# Patient Record
Sex: Male | Born: 1970
Health system: Southern US, Community
[De-identification: ages and names within clinical notes are randomized; demographics above are authoritative.]

---

## 2012-01-21 ENCOUNTER — Ambulatory Visit (INDEPENDENT_AMBULATORY_CARE_PROVIDER_SITE_OTHER): Payer: BC Managed Care – PPO | Admitting: Emergency Medicine

## 2012-01-21 VITALS — BP 138/80 | HR 78 | Temp 98.2°F | Resp 17 | Ht 70.0 in | Wt 225.0 lb

## 2012-01-21 DIAGNOSIS — B351 Tinea unguium: Secondary | ICD-10-CM

## 2012-01-21 DIAGNOSIS — H6590 Unspecified nonsuppurative otitis media, unspecified ear: Secondary | ICD-10-CM

## 2012-01-21 MED ORDER — PSEUDOEPHEDRINE-GUAIFENESIN ER 120-1200 MG PO TB12: 1.0000 | ORAL_TABLET | Freq: Two times a day (BID) | ORAL | Status: DC

## 2012-01-21 MED ORDER — TERBINAFINE HCL 250 MG PO TABS: 250.0000 mg | ORAL_TABLET | Freq: Every day | ORAL | Status: AC

## 2012-01-21 NOTE — Progress Notes (Signed)
   Patient Name: Jerome Gallegos Date of Birth: 05/02/1971 Medical Record Number: [id number] Gender: male Date of Encounter: 01/21/2012  Chief Complaint: Otalgia and Nail Problem   History of Present Illness:  Jerome Gallegos is a 41 y.o. very pleasant male patient who presents with the following:  Since Friday has experienced decreased hearing in right ear and no pain, discharge, fever or chills.  History of nail fungus previously treated now recurrent 3 years later.  There is no problem list on file for this patient.  No past medical history on file. No past surgical history on file. History  Substance Use Topics  . Smoking status: Never Smoker   . Smokeless tobacco: Not on file  . Alcohol Use: Not on file   No family history on file. No Known Allergies  Medication list has been reviewed and updated.  No current outpatient prescriptions on file prior to visit.    Review of Systems:  As per HPI, otherwise negative.    Physical Examination: Filed Vitals:   01/21/12 1659  BP: 138/80  Pulse: 78  Temp: 98.2 F (36.8 C)  Resp: 17   Filed Vitals:   01/21/12 1659  Height: 5\' 10"  (1.778 m)  Weight: 225 lb (102.059 kg)   Body mass index is 32.28 kg/(m^2). Ideal Body Weight: Weight in (lb) to have BMI = 25: 173.9    GEN: WDWN, NAD, Non-toxic, Alert & Oriented x 3 HEENT: Atraumatic, Normocephalic.  Ears and Nose: No external deformity.  Right ear serous otitis EXTR: No clubbing/cyanosis/edema.  Bilateral onychomycosis NEURO: Normal gait.  PSYCH: Normally interactive. Conversant. Not depressed or anxious appearing.  Calm demeanor.    EKG / Labs / Xrays: None available at time of encounter  Assessment and Plan: Serous otitis media Onychomycosis Rx:  mucinex and lamisil  Jerome Dane, MD

## 2014-04-11 ENCOUNTER — Encounter (HOSPITAL_COMMUNITY): Payer: Self-pay | Admitting: Emergency Medicine

## 2014-04-11 DIAGNOSIS — Z79899 Other long term (current) drug therapy: Secondary | ICD-10-CM | POA: Insufficient documentation

## 2014-04-11 DIAGNOSIS — N489 Disorder of penis, unspecified: Secondary | ICD-10-CM | POA: Diagnosis present

## 2014-04-11 DIAGNOSIS — N501 Vascular disorders of male genital organs: Secondary | ICD-10-CM | POA: Diagnosis not present

## 2014-04-11 LAB — URINALYSIS, ROUTINE W REFLEX MICROSCOPIC
Bilirubin Urine: NEGATIVE
GLUCOSE, UA: NEGATIVE mg/dL
KETONES UR: NEGATIVE mg/dL
LEUKOCYTES UA: NEGATIVE
NITRITE: NEGATIVE
PH: 6 (ref 5.0–8.0)
PROTEIN: NEGATIVE mg/dL
Specific Gravity, Urine: 1.021 (ref 1.005–1.030)
Urobilinogen, UA: 0.2 mg/dL (ref 0.0–1.0)

## 2014-04-11 LAB — URINE MICROSCOPIC-ADD ON

## 2014-04-11 NOTE — ED Notes (Addendum)
PT was urinating and squeezed penis hard and felt pain about 1530. Noticed blood at 2000. Bleeding coming out tip. Concerned for injury. PT reports no pain when urination. Feeling urgency.

## 2014-04-12 ENCOUNTER — Emergency Department (HOSPITAL_COMMUNITY)
Admission: EM | Admit: 2014-04-12 | Discharge: 2014-04-12 | Disposition: A | Discharge status: Home / Self Care | Payer: BC Managed Care – PPO | Attending: Emergency Medicine | Admitting: Emergency Medicine

## 2014-04-12 DIAGNOSIS — N4889 Other specified disorders of penis: Secondary | ICD-10-CM

## 2014-04-12 NOTE — ED Notes (Signed)
Tiffany, PA-C at bedside to examine, with RN present as witness.

## 2014-04-12 NOTE — ED Provider Notes (Signed)
CSN: [id number]     Arrival date & time 04/11/14  2259 History   First MD Initiated Contact with Patient 04/12/14 0040     Chief Complaint  Patient presents with  . Hematuria  . Penile Discharge     (Consider location/radiation/quality/duration/timing/severity/associated sxs/prior Treatment) HPI  Patient to the ER with complaints of penile pain and bleeding. He reports using the restroom when he heard someone walking in on him. He therefore accidently squeezed the shaft of his penis causing immediate pain. A few hours later he noticed a small amount of bleeding in his underwear that had come from his penis. He is having mild urinary hesitancy and a small amount of hematuria. He reports that his urine has since cleared up. He does not have any pain when he is not urinary and when he is urinating he has no pain. No fevers, denies deformity.  History reviewed. No pertinent past medical history. History reviewed. No pertinent past surgical history. History reviewed. No pertinent family history. History  Substance Use Topics  . Smoking status: Never Smoker   . Smokeless tobacco: Not on file  . Alcohol Use: Not on file    Review of Systems   Review of Systems  Gen: no weight loss, fevers, chills, night sweats  Eyes: no occular draining, occular pain,  No visual changes  Nose: no epistaxis or rhinorrhea  Mouth: no dental pain, no sore throat  Neck: no neck pain  Lungs: No hemoptysis. No wheezing or coughing CV:  No palpitations, dependent edema or orthopnea. No chest pain Abd: no diarrhea. No nausea or vomiting, No abdominal pain  GU: + urinary hesitancy, penile pain, hematuria MSK:  No muscle weakness, No muscular pain Neuro: no headache, no focal neurologic deficits  Skin: no rash , no wounds Psyche: no complaints of depression or anxiety     Allergies  Review of patient's allergies indicates no known allergies.  Home Medications   Prior to Admission medications    Medication Sig Start Date End Date Taking? Authorizing Provider  GuaiFENesin (MUCINEX PO) Take 1 tablet by mouth 2 (two) times daily.   Yes Historical Provider, MD   BP 127/76  Pulse 57  Temp(Src) 97.7 F (36.5 C) (Oral)  Resp 20  SpO2 100% Physical Exam  Nursing note and vitals reviewed. Constitutional: He appears well-developed and well-nourished. No distress.  HENT:  Head: Normocephalic and atraumatic.  Eyes: Pupils are equal, round, and reactive to light.  Neck: Normal range of motion. Neck supple.  Cardiovascular: Normal rate and regular rhythm.   Pulmonary/Chest: Effort normal.  Abdominal: Soft.  Genitourinary: Uncircumcised. No penile erythema. No discharge found.  No swelling, laceration, deformity or tenderness to the head or shaft of the penis.  Neurological: He is alert.  Skin: Skin is warm and dry.    ED Course  Procedures (including critical care time) Labs Review Labs Reviewed  URINALYSIS, ROUTINE W REFLEX MICROSCOPIC - Abnormal; Notable for the following:    Hgb urine dipstick SMALL (*)    All other components within normal limits  GC/CHLAMYDIA PROBE AMP  URINE MICROSCOPIC-ADD ON    Imaging Review No results found.   EKG Interpretation None      MDM   Final diagnoses:  Penile bleeding  Penis pain    Pt accidentally squeezed his penis too hard earlier today. Was concerned because he was actively passing blood earlier today. This has stopped over the past few hours and he has urinated multiple times and denies  continually having bleeding. Reports that pain has also resolved. Denies any more penile pain. NO bruising of obvious injury to the shaft. Most likely bruising and broken blood vessels that have gained hemostasis.   Advised to give himself 24-48 hours for symptoms to resolve. Should not have anymore active bleeding. If he does needs to be seen right away. Referral to urology given.  43 y.o.Florence Brouillard's evaluation in the Emergency  Department is complete. It has been determined that no acute conditions requiring further emergency intervention are present at this time. The patient/guardian have been advised of the diagnosis and plan. We have discussed signs and symptoms that warrant return to the ED, such as changes or worsening in symptoms.  Vital signs are stable at discharge. Filed Vitals:   04/12/14 0123  BP: 127/76  Pulse: 57  Temp: 97.7 F (36.5 C)  Resp: 20    Patient/guardian has voiced understanding and agreed to follow-up with the PCP or specialist.     Dorthula Matas, PA-C 04/12/14 0129

## 2014-04-12 NOTE — ED Provider Notes (Signed)
Medical screening examination/treatment/procedure(s) were performed by non-physician practitioner and as supervising physician I was immediately available for consultation/collaboration.   EKG Interpretation None        Tomasita Crumble, MD 04/12/14 4696

## 2014-04-12 NOTE — Discharge Instructions (Signed)
Hematuria °(Hematuria) °La hematuria es la presencia de sangre en la orina. La causa puede ser una infección en la vejiga, en los riñones, en la próstata, cálculos renales o cáncer de las vías urinarias. Normalmente las infecciones pueden tratarse con medicamentos, y los cálculos renales, por lo general, se eliminarán a través de la orina. Si ninguna de estas es la causa de su hematuria, quizás se necesiten más estudios para descubrir el motivo. °Es muy importante que le informe a su médico si ve sangre en la orina, aunque el sangrado se detenga sin tratamiento o no cause dolor. La sangre que aparece en la orina y luego se detiene y vuelve a aparecer nuevamente puede ser un síntoma de una enfermedad muy grave. Además, el dolor no es un síntoma en las etapas iniciales de muchos tipos de cáncer urinarios. °INSTRUCCIONES PARA EL CUIDADO EN EL HOGAR  °· Beba mucho líquido, entre 3 a 4 litros por día. Si le han diagnosticado una infección, se recomienda especialmente el jugo de arándanos rojos, además de grandes cantidades de agua. °· Evite la cafeína, el té y las bebidas con gas porque suelen irritar la vejiga. °· Evite el alcohol ya que puede irritar la próstata. °· Tome todos los medicamentos como se lo haya indicado el médico. °· Si le recetaron antibióticos, asegúrese de terminarlos, incluso si comienza a sentirse mejor. °· Si le han diagnosticado cálculos renales, siga las instrucciones de su médico con respecto a filtrar la orina para rescatar el cálculo. °· Vacíe la vejiga con frecuencia. Evite retener la orina durante largos períodos. °· Después de defecar, las mujeres deben higienizarse desde adelante hacia atrás. Use solo un papel por vez. °· Si es una mujer, vacíe la vejiga antes y después de tener relaciones sexuales. °SOLICITE ATENCIÓN MÉDICA SI: °· Siente dolor en la espalda. °· Tiene fiebre. °· Tiene sensación de malestar estomacal (náuseas) o vómitos. °· Los síntomas no mejoran después de 3 días. Si la  condición empeora, visite al médico antes de lo previsto. °SOLICITE ATENCIÓN MÉDICA DE INMEDIATO SI:  °· Vomita con frecuencia e intensamente y no puede tolerar los medicamentos. °· Siente un dolor intenso en la espalda o el abdomen incluso tomando los medicamentos. °· Elimina coágulos grandes o sangre con la orina. °· Se siente extremadamente débil, se desmaya o pierde el conocimiento. °ASEGÚRESE DE QUE:  °· Comprende estas instrucciones. °· Controlará su afección. °· Recibirá ayuda de inmediato si no mejora o si empeora. °Document Released: 07/07/2005 Document Revised: 11/21/2013 °ExitCare® Patient Information ©2015 [employer]. This information is not intended to replace advice given to you by your health care provider. Make sure you discuss any questions you have with your health care provider. ° °

## 2014-04-14 LAB — GC/CHLAMYDIA PROBE AMP
CT Probe RNA: NEGATIVE
GC Probe RNA: NEGATIVE

## 2017-04-17 ENCOUNTER — Ambulatory Visit (INDEPENDENT_AMBULATORY_CARE_PROVIDER_SITE_OTHER): Payer: 59 | Admitting: Emergency Medicine

## 2017-04-17 ENCOUNTER — Encounter: Payer: Self-pay | Admitting: Emergency Medicine

## 2017-04-17 VITALS — BP 104/70 | HR 83 | Temp 98.1°F | Resp 16 | Ht 69.25 in | Wt 222.2 lb

## 2017-04-17 DIAGNOSIS — M549 Dorsalgia, unspecified: Secondary | ICD-10-CM | POA: Diagnosis not present

## 2017-04-17 DIAGNOSIS — M546 Pain in thoracic spine: Secondary | ICD-10-CM | POA: Diagnosis not present

## 2017-04-17 DIAGNOSIS — B351 Tinea unguium: Secondary | ICD-10-CM

## 2017-04-17 MED ORDER — TERBINAFINE HCL 250 MG PO TABS
250.0000 mg | ORAL_TABLET | Freq: Every day | ORAL | 1 refills | Status: DC
Start: 2017-04-17 — End: 2017-07-05

## 2017-04-17 MED ORDER — CYCLOBENZAPRINE HCL 10 MG PO TABS: 10.0000 mg | ORAL_TABLET | Freq: Three times a day (TID) | ORAL | 0 refills | Status: AC | PRN

## 2017-04-17 MED ORDER — DICLOFENAC SODIUM 75 MG PO TBEC: 75.0000 mg | DELAYED_RELEASE_TABLET | Freq: Two times a day (BID) | ORAL | 0 refills | Status: AC

## 2017-04-17 NOTE — Patient Instructions (Addendum)
     IF you received an x-ray today, you will receive an invoice from [facility]. Please contact [facility] at [phone] with questions or concerns regarding your invoice.   IF you received labwork today, you will receive an invoice from [facility]. Please contact [facility] at [phone] with questions or concerns regarding your invoice.   Our billing staff will not be able to assist you with questions regarding bills from these companies.  You will be contacted with the lab results as soon as they are available. The fastest way to get your results is to activate your My Chart account. Instructions are located on the last page of this paperwork. If you have not heard from us regarding the results in 2 weeks, please contact this office.     Dolor musculoesqueltico (Musculoskeletal Pain) El dolor musculoesqueltico comprende dolores y molestias en los msculos y en los huesos. Este dolor puede ocurrir en cualquier parte del cuerpo. INSTRUCCIONES PARA EL CUIDADO EN EL HOGAR  Solo tome medicamentos para calmar el dolor, el malestar o bajar la fiebre, segn las indicaciones del mdico.  Podr seguir con todas las actividades a menos que stas le ocasionen ms dolor. Cuando el dolor disminuya, retome las actividades habituales lentamente. Aumente gradualmente la intensidad y la duracin de sus actividades o del ejercicio.  Durante los perodos de dolor intenso, el reposo en cama puede ser beneficioso. Acustese o sintese en cualquier posicin que sea cmoda, pero salga de la cama y camine al menos cada varias horas.  Si se lo indican, aplique hielo sobre la zona de la lesin. ? Ponga el hielo en una bolsa plstica. ? Coloque una toalla entre la piel y la bolsa de hielo. ? Coloque el hielo durante 20minutos, 2 a 3veces por da.  SOLICITE ATENCIN MDICA SI:  El dolor empeora.  El dolor no se alivia con los medicamentos.  Pierde la funcionalidad en la zona del  dolor si este se manifiesta en los brazos, las piernas o el cuello.  Esta informacin no tiene como fin reemplazar el consejo del mdico. Asegrese de hacerle al mdico cualquier pregunta que tenga. Document Released: 04/16/2005 Document Revised: 09/29/2011 Document Reviewed: 03/11/2013 Elsevier Interactive Patient Education  2017 Elsevier Inc.  

## 2017-04-17 NOTE — Progress Notes (Signed)
Jerome Gallegos 46 y.o.   Chief Complaint  Patient presents with  . Back Pain    upper - per patient since this morning     HISTORY OF PRESENT ILLNESS: [name] complaining of left upper-mid back pain since lifting heavy object today. Also c/o fungal infection to several toenails.  HPI   Prior to Admission medications   Not on File    No Known Allergies  There are no active problems to display for this patient.   No past medical history on file.  No past surgical history on file.  Social History   Social History  . Marital status: Married    Spouse name: N/A  . Number of children: N/A  . Years of education: N/A   Occupational History  . Not on file.   Social History Main Topics  . Smoking status: Never Smoker  . Smokeless tobacco: Never Used  . Alcohol use No  . Drug use: No  . Sexual activity: Not on file   Other Topics Concern  . Not on file   Social History Narrative  . No narrative on file    Family History  Problem Relation Age of Onset  . Diabetes Mother      Review of Systems  Constitutional: Negative.  Negative for chills and fever.  HENT: Negative.   Eyes: Negative.   Respiratory: Negative for cough and shortness of breath.   Cardiovascular: Negative for chest pain, palpitations, leg swelling and PND.  Gastrointestinal: Negative for abdominal pain, nausea and vomiting.  Genitourinary: Negative for dysuria, flank pain and hematuria.  Musculoskeletal: Positive for back pain.  Skin: Negative for rash.  Neurological: Negative for dizziness, sensory change, focal weakness and headaches.  Endo/Heme/Allergies: Negative.   All other systems reviewed and are negative.   Vitals:   04/17/17 1636  BP: 104/70  Pulse: 83  Resp: 16  Temp: 98.1 F (36.7 C)  SpO2: 98%    Physical Exam  Constitutional: He is oriented to person, place, and time. He appears well-developed and well-nourished.  HENT:  Head: Normocephalic and  atraumatic.  Eyes: Pupils are equal, round, and reactive to light. EOM are normal.  Neck: Normal range of motion. Neck supple.  Cardiovascular: Normal rate, regular rhythm and normal heart sounds.   Pulmonary/Chest: Effort normal. He exhibits no tenderness.  Abdominal: Soft. There is no tenderness.  Musculoskeletal:  +tenderness to left scapular area.  Neurological: He is alert and oriented to person, place, and time.  Skin: Skin is warm and dry.  +onychomycosis several toenails both feet.  Vitals reviewed.    ASSESSMENT & PLAN: Jerome Gallegos was seen today for back pain.  Diagnoses and all orders for this visit:  Acute left-sided thoracic back pain  Musculoskeletal back pain  Onychomycosis of toenail  Other orders -     cyclobenzaprine (FLEXERIL) 10 MG tablet; Take 1 tablet (10 mg total) by mouth 3 (three) times daily as needed for muscle spasms. -     diclofenac (VOLTAREN) 75 MG EC tablet; Take 1 tablet (75 mg total) by mouth 2 (two) times daily. -     terbinafine (LAMISIL) 250 MG tablet; Take 1 tablet (250 mg total) by mouth daily.    Patient Instructions       IF you received an x-ray today, you will receive an invoice from [facility]. Please contact [facility] at [phone] with questions or concerns regarding your invoice.   IF you received labwork today, you  will receive an invoice from [facility]. Please contact [facility] at [phone] with questions or concerns regarding your invoice.   Our billing staff will not be able to assist you with questions regarding bills from these companies.  You will be contacted with the lab results as soon as they are available. The fastest way to get your results is to activate your My Chart account. Instructions are located on the last page of this paperwork. If you have not heard from [place] regarding the results in 2 weeks, please contact this office.     Dolor musculoesqueltico (Musculoskeletal Pain) El dolor  musculoesqueltico comprende dolores y Associate Professor en los msculos y en los [employer]. Este dolor puede ocurrir en cualquier parte del cuerpo. INSTRUCCIONES PARA EL CUIDADO EN EL HOGAR  Solo tome medicamentos para calmar el dolor, el malestar o bajar la fiebre, segn las indicaciones del mdico.  Podr seguir con todas las actividades a menos que stas le ocasionen ms [employer] Cuando el dolor disminuya, retome las actividades habituales lentamente. Aumente gradualmente la intensidad y la duracin de sus actividades o del ejercicio.  Durante los perodos de dolor intenso, el reposo en cama puede ser beneficioso. Acustese o sintese en cualquier posicin que sea cmoda, pero salga de la cama y camine al menos cada varias horas.  Si se lo indican, aplique hielo sobre la zona de la lesin. ? Ponga el hielo en una bolsa plstica. ? Coloque una [employer] piel y la bolsa de hielo. ? Coloque el hielo durante , 2 a 3veces por da.  SOLICITE ATENCIN MDICA SI:  El dolor empeora.  El dolor no se alivia con los [employer].  Pierde la funcionalidad en la zona del dolor si este se manifiesta en los brazos, las piernas o el cuello.  Esta informacin no tiene Theme park manager el consejo del mdico. Asegrese de hacerle al mdico cualquier pregunta que tenga. Document Released: 04/16/2005 Document Revised: 09/29/2011 Document Reviewed: 03/11/2013 Elsevier Interactive Patient Education  2017 Elsevier Inc.      Edwina Barth, MD [facility]

## 2017-05-18 ENCOUNTER — Ambulatory Visit (INDEPENDENT_AMBULATORY_CARE_PROVIDER_SITE_OTHER): Payer: 59 | Admitting: Family Medicine

## 2017-05-18 ENCOUNTER — Encounter: Payer: Self-pay | Admitting: Family Medicine

## 2017-05-18 VITALS — BP 136/68 | HR 67 | Temp 98.4°F | Resp 17 | Ht 69.25 in | Wt 223.4 lb

## 2017-05-18 DIAGNOSIS — R590 Localized enlarged lymph nodes: Secondary | ICD-10-CM | POA: Diagnosis not present

## 2017-05-18 DIAGNOSIS — R1032 Left lower quadrant pain: Secondary | ICD-10-CM

## 2017-05-18 MED ORDER — CEPHALEXIN 500 MG PO CAPS: 500.0000 mg | ORAL_CAPSULE | Freq: Three times a day (TID) | ORAL | 0 refills | Status: AC

## 2017-05-18 NOTE — Patient Instructions (Addendum)
Apply Tinactin powder to the groin to help relieve the pain Stop the Terbinafine for a week Do not touch or squeeze the lymph node     IF you received an x-ray today, [facility]. Please contact [facility] at [phone] with questions or concerns regarding your invoice.   IF you received labwork today, you will receive an invoice from [facility]. Please contact [facility] at [phone] with questions or concerns regarding your invoice.   Our billing staff will not be able to assist you with questions regarding bills from these companies.  You will be contacted with the lab results as soon as they are available. The fastest way to get your results is to activate your My Chart account. Instructions are located on the last page of this paperwork. If you have not heard from us regarding the results in 2 weeks, please contact this office.    Linfadenopata (Lymphadenopathy) El trmino linfadenopata hace referencia a la hinchazn o el agrandamiento de los ganglios linfticos. Los ganglios linfticos forman parte del sistema de defensa del organismo (inmunitario) que protege al cuerpo contra las infecciones, los microbios y University [employer] [place] Johnstownlas enfermedades. Estos ganglios se encuentran en muchas partes del cuerpo, incluido el cuello, las axilas y las ingles. El aumento de tamao de los ganglios linfticos puede tener muchas causas. Cuando el sistema inmunitario responde a los microbios, como virus o bacterias, se produce la acumulacin de lquido y de las clulas que combaten las infecciones. Esto causa el aumento de tamao de los ganglios. Generalmente, este no es un motivo de preocupacin. La hinchazn y Chief Technology Officerel dolor suelen desaparecer sin tratamiento. Sin embargo, la hinchazn de los ganglios linfticos tambin puede deberse a Designer, fashion/clothingmuchas enfermedades. El mdico puede hacerle varios estudios para ayudar a Clinical research associatedeterminar la causa. Si no puede determinarse la causa de la  hinchazn, es importante vigilar el cuadro clnico para asegurarse de que este sntoma desaparezca. INSTRUCCIONES PARA EL CUIDADO EN EL HOGAR Controle su afeccin para ver si hay cambios. Las siguientes indicaciones ayudarn a Architectural technologistaliviar cualquier molestia que pueda sentir:  Descanse lo suficiente.  Tome los medicamentos solamente como se lo haya indicado el mdico. El mdico puede recomendarle medicamentos de venta libre para Chief Technology Officerel dolor.  Aplique compresas de calor hmedo en el lugar de los ganglios linfticos hinchados como se lo haya indicado el mdico. Esto puede ayudar a Engineer, materialsaliviar el dolor.  Contrlese diariamente los ganglios linfticos para Insurance risk surveyordetectar cualquier cambio.  Concurra a todas las visitas de control como se lo haya indicado el mdico. Esto es importante. SOLICITE ATENCIN MDICA SI:  Los ganglios linfticos siguen hinchados despus de 2semanas.  La hinchazn aumenta o se extiende a otras zonas.  Los ganglios linfticos estn endurecidos, parecen estar pegados a la piel o crecen rpidamente.  La piel sobre los ganglios linfticos est enrojecida e inflamada.  Tiene fiebre.  Tiene escalofros.  Tiene fatiga.  Tiene dolor de Advertising copywritergarganta.  Siente dolor abdominal.  [place].  Tiene transpiracin nocturna. SOLICITE ATENCIN MDICA DE INMEDIATO SI:  Observa una prdida de lquido de la zona donde los ganglios linfticos estn agrandados.  Tiene dolor intenso en cualquier parte del cuerpo.  Siente dolor en el pecho.  Le falta el aire. Esta informacin no tiene Theme park managercomo fin reemplazar el consejo del mdico. Asegrese de hacerle al mdico cualquier pregunta que tenga. Document Released: 10/03/2008 Document Revised: 07/28/2014 Document Reviewed: 02/09/2014 Elsevier Interactive Patient Education  [employer]2018 Elsevier Inc.

## 2017-05-18 NOTE — Progress Notes (Signed)
  Chief Complaint  Patient presents with  . Recurrent Skin Infections    groin area left side, onset this morning while showering, per pt thinks it could be a ? side effect to terbinafine.  Pain lever 3/10 , soreness to touch.    HPI   Pt reports that he started having a bump in his right groin that was painful but did not drain agy pus or have any open breaks in the skin.  He denies fevers or chills. No pain in his penis or testicles just soreness in the inguinal area. No bulge or hernia.  He googled it and thought his issue could be due to a medication side effect from the terbinafine   No past medical history on file.  Current Outpatient Prescriptions  Medication Sig Dispense Refill  . terbinafine (LAMISIL) 250 MG tablet Take 250 mg by mouth daily.    . cephALEXin (KEFLEX) 500 MG capsule Take 1 capsule (500 mg total) by mouth 3 (three) times daily. 15 capsule 0  . cyclobenzaprine (FLEXERIL) 10 MG tablet Take 1 tablet (10 mg total) by mouth 3 (three) times daily as needed for muscle spasms. (Patient not taking: Reported on 05/18/2017) 30 tablet 0   No current facility-administered medications for this visit.     Allergies: No Known Allergies  No past surgical history on file.  Social History   Social History  . Marital status: Married    Spouse name: N/A  . Number of children: N/A  . Years of education: N/A   Social History Main Topics  . Smoking status: Never Smoker  . Smokeless tobacco: Never Used  . Alcohol use No  . Drug use: No  . Sexual activity: Not Asked   Other Topics Concern  . None   Social History Narrative  . None    Family History  Problem Relation Age of Onset  . Diabetes Mother      ROS Review of Systems See HPI Constitution: No fevers or chills No malaise No diaphoresis Skin: No rash or itching Eyes: no blurry vision, no double vision GU: no dysuria or hematuria Neuro: no dizziness or headaches  Objective: Vitals:   05/18/17 1226   BP: 136/68  Pulse: 67  Resp: 17  Temp: 98.4 F (36.9 C)  TempSrc: Oral  SpO2: 98%  Weight: 223 lb 6.4 oz (101.3 kg)  Height: 5' 9.25" (1.759 m)    Physical Exam  Constitutional: He is oriented to person, place, and time. He appears well-developed and well-nourished.  HENT:  Head: Normocephalic and atraumatic.  Eyes: Conjunctivae and EOM are normal.  Pulmonary/Chest: Effort normal.  Genitourinary:     Neurological: He is alert and oriented to person, place, and time.  Psychiatric: His behavior is normal. Judgment and thought content normal.    Chaperone present Left groin in the inguinal area adjacent to the femoral artery showing tender, well circumscribed lymph node, no overlying erythema, no fluctuance or induration Circumcised penis with normal glans nontender testicle No skin lesion  Assessment and Plan Jerome Gallegos was seen today for recurrent skin infections.  Diagnoses and all orders for this visit:  Pelvic lymphadenopathy  Groin pain, left  Discussed home care Avoid touching and squeezing Keflex for treatment  rtc for persistent symptoms  -     cephALEXin (KEFLEX) 500 MG capsule; Take 1 capsule (500 mg total) by mouth 3 (three) times daily.     Jerome Gallegos

## 2017-05-26 ENCOUNTER — Encounter: Payer: Self-pay | Admitting: Physician Assistant

## 2017-05-26 ENCOUNTER — Ambulatory Visit: Payer: 59 | Admitting: Physician Assistant

## 2017-05-26 VITALS — BP 118/88 | HR 73 | Temp 98.9°F | Resp 18 | Ht 69.25 in | Wt 221.4 lb

## 2017-05-26 DIAGNOSIS — L03314 Cellulitis of groin: Secondary | ICD-10-CM | POA: Diagnosis not present

## 2017-05-26 NOTE — Progress Notes (Signed)
Subjective:    Patient ID: Jerome Gallegos, male    DOB: 1970/08/22, 46 y.o.   MRN: [id number]  HPI  Mr. Jerome Gallegos is a 46 year old Hispanic male with no past medical history pertinent to today's visit who presents for follow-up of lymphadenopathy. Mr. Jerome Gallegos was seen by Dr. Creta LevinStallings on 05/18/17 for a "bump in his groin". He was diagnosed with pelvic lymphadenopathy and left groin pain. Patient was treated with Cephalexin 500mg  3 times per day. Today patient reports the bump stayed the same and hard for awhile and then 3 days ago he noticed it looked like a pimple. Mr. Jerome Gallegos states that this morning when he woke up the area was soft, the pimple was no longer there,and it looked like the skin peeled off. Mr. Jerome Gallegos reports it looked like the pimple may have popped and drained, but he did not notice it popping. He also states he does not look at the area very often, because when he touches it the area itches. Mr. Jerome Gallegos reports he finished the course of Cephalexin, but missed the last dose.   Review of Systems  Constitutional: Positive for fatigue (the first 3 days on the medication, but has resolved.). Negative for chills and fever.  Genitourinary: Negative for difficulty urinating, discharge, dysuria, frequency, hematuria and penile pain.  Skin: Positive for color change (area in the groin looks darker). Negative for rash.  Hematological: Negative for adenopathy.   Medications:  Prior to Admission medications   Medication Sig Start Date End Date Taking? Authorizing Provider  cyclobenzaprine (FLEXERIL) 10 MG tablet Take 1 tablet (10 mg total) by mouth 3 (three) times daily as needed for muscle spasms. Patient not taking: Reported on 05/18/2017 04/17/17   Georgina QuintSagardia, Miguel Jose, MD  terbinafine (LAMISIL) 250 MG tablet Take 250 mg by mouth daily.    [provider]   Allergies:  No Known Allergies  Chronic Medical Conditions:  Patient Active Problem List   Diagnosis  Date Noted  . Acute left-sided thoracic back pain 04/17/2017  . Musculoskeletal back pain 04/17/2017  . Onychomycosis of toenail 04/17/2017      Objective:   Physical Exam  Constitutional: He appears well-developed and well-nourished. He is active and cooperative. No distress.  BP 118/88 (BP Location: Left Arm, Patient Position: Sitting, Cuff Size: Normal)   Pulse 73   Temp 98.9 F (37.2 C) (Oral)   Resp 18   Ht 5' 9.25" (1.759 m)   Wt 221 lb 6.4 oz (100.4 kg)   SpO2 98%   BMI 32.46 kg/m    HENT:  Head: Normocephalic and atraumatic.  Eyes: Conjunctivae are normal.  Neck: Normal range of motion. Neck supple. No thyromegaly present.  Cardiovascular: Normal rate, regular rhythm, S1 normal, S2 normal, normal heart sounds and intact distal pulses.  No murmur heard. Pulses:      Radial pulses are 2+ on the right side, and 2+ on the left side.  Pulmonary/Chest: Effort normal and breath sounds normal. He has no wheezes.  Genitourinary:     Lymphadenopathy:    He has no cervical adenopathy.  Neurological: He is alert.  Skin: Skin is warm and dry. He is not diaphoretic.      Assessment & Plan:  1. Cellulitis of groin - Informed patient that this is sometimes seen during treatment with antibiotics for lymphadenopathy and that it is normal. - Also informed patient that he can restart Lamisil  - Patient to return to clinic if pain worsens or  does not improve  Virginia Curl, PA-S

## 2017-05-26 NOTE — Patient Instructions (Addendum)
You may restart the Lamisil (terbinafine).    IF you received an x-ray today, you will receive an invoice from [facility]. Please contact [facility] at [phone] with questions or concerns regarding your invoice.   IF you received labwork today, you will receive an invoice from [facility]. Please contact [facility] at [phone] with questions or concerns regarding your invoice.   Our billing staff will not be able to assist you with questions regarding bills from these companies.  You will be contacted with the lab results as soon as they are available. The fastest way to get your results is to activate your My Chart account. Instructions are located on the last page of this paperwork. If you have not heard from us regarding the results in 2 weeks, please contact this office.

## 2017-05-26 NOTE — Progress Notes (Signed)
     Patient ID: Raymon MuttonManuel Hillesheim, male    DOB: 1971/06/10, 46 y.o.   MRN: [id number]  PCP: Patient, No Pcp Per  Chief Complaint  Patient presents with  . Lymphadenopathy    pelvic, pt states this morning the lymph node was soft and skin was peeling back. Pt states the last couple of days it has looked like a pimple.  . Follow-up    Subjective:   Presents for evaluation of lymphadenopathy of the LEFT groin.  He presented for same on 05/18/2017 and saw one of my colleagues. He had lymphadenopathy in the groin and was prescribed cephalexin.  He completed the antibiotic course save one tablet. He notes that since his last visit, one of the lumps developed a head, like a pimple, and then the lesion seemed to begin to resolve. He was not aware of any drainange, but it is much smaller, feels better and now has the appearance that the skin is peeling.  No new lesions or tender lumps.    Review of Systems No fever, chills. No new or tender lumps. No nausea, vomiting or diarrhea.    Patient Active Problem List   Diagnosis Date Noted  . Acute left-sided thoracic back pain 04/17/2017  . Musculoskeletal back pain 04/17/2017  . Onychomycosis of toenail 04/17/2017     Prior to Admission medications   Medication Sig Start Date End Date Taking? Authorizing Provider  cyclobenzaprine (FLEXERIL) 10 MG tablet Take 1 tablet (10 mg total) by mouth 3 (three) times daily as needed for muscle spasms. Patient not taking: Reported on 05/18/2017 04/17/17   Georgina QuintSagardia, Miguel Jose, MD  terbinafine (LAMISIL) 250 MG tablet Take 250 mg by mouth daily.    [provider]     No Known Allergies     Objective:  Physical Exam  Constitutional: He is oriented to person, place, and time. He appears well-developed and well-nourished. He is active and cooperative. No distress.  BP 118/88 (BP Location: Left Arm, Patient Position: Sitting, Cuff Size: Normal)   Pulse 73   Temp 98.9 F (37.2 C)  (Oral)   Resp 18   Ht 5' 9.25" (1.759 m)   Wt 221 lb 6.4 oz (100.4 kg)   SpO2 98%   BMI 32.46 kg/m    Eyes: Conjunctivae are normal.  Pulmonary/Chest: Effort normal.  Genitourinary:     Neurological: He is alert and oriented to person, place, and time.  Psychiatric: He has a normal mood and affect. His speech is normal and behavior is normal.           Assessment & Plan:   1. Cellulitis of groin Resolving, s/p cephalexin and spontaneous drainage. Anticipatory guidance provided.    Return if symptoms worsen or fail to improve.   Fernande Brashelle S. Kobi Aller, PA-C [facility]

## 2017-06-23 ENCOUNTER — Other Ambulatory Visit: Payer: Self-pay | Admitting: Emergency Medicine

## 2017-06-24 NOTE — Telephone Encounter (Signed)
Request for high risk medication

## 2017-06-25 ENCOUNTER — Other Ambulatory Visit: Payer: Self-pay

## 2017-06-25 NOTE — Telephone Encounter (Signed)
Last visit was 11/06 and terbinafine was not resumed.  Chart reviewed. This was prescribed by Dr. Alvy BimlerSagardia at the visit 04/17/2017 for onychomycosis. Typically, a 90-day course is all that is needed, though it takes 9-12 months for toenails to grow out completely and therefore to eliminate all of the affected nail.  If the patient believes that he needs a refill, please advise him to schedule a follow-up visit.

## 2017-06-25 NOTE — Telephone Encounter (Signed)
Duplicate - sent to scheduling for appt

## 2017-06-25 NOTE — Telephone Encounter (Signed)
Per Chelle, if pt wants to refill, needs to have follow up visit first

## 2017-06-25 NOTE — Telephone Encounter (Signed)
Refill req for Lamisil. Last ov 05/24/2017 Lamisil was restarted. Sent to Chelle to clarify # and RF.

## 2017-07-05 ENCOUNTER — Other Ambulatory Visit: Payer: Self-pay | Admitting: Emergency Medicine

## 2017-08-20 ENCOUNTER — Telehealth: Payer: Self-pay

## 2017-08-20 NOTE — Telephone Encounter (Signed)
Completed form for Lamasil refill as requested by [employer], however, documented on form that patient needs an OV before medication can be refilled.   LMVM of patient notifying him of this.

## 2017-09-01 ENCOUNTER — Ambulatory Visit: Payer: 59 | Admitting: Emergency Medicine

## 2017-09-02 ENCOUNTER — Other Ambulatory Visit: Payer: Self-pay

## 2017-09-02 ENCOUNTER — Encounter: Payer: Self-pay | Admitting: Emergency Medicine

## 2017-09-02 ENCOUNTER — Ambulatory Visit (INDEPENDENT_AMBULATORY_CARE_PROVIDER_SITE_OTHER): Payer: 59 | Admitting: Emergency Medicine

## 2017-09-02 VITALS — BP 100/70 | HR 67 | Temp 97.8°F | Resp 16 | Ht 69.75 in | Wt 220.4 lb

## 2017-09-02 DIAGNOSIS — B351 Tinea unguium: Secondary | ICD-10-CM | POA: Diagnosis not present

## 2017-09-02 DIAGNOSIS — L918 Other hypertrophic disorders of the skin: Secondary | ICD-10-CM | POA: Diagnosis not present

## 2017-09-02 MED ORDER — TERBINAFINE HCL 250 MG PO TABS: 250.0000 mg | ORAL_TABLET | Freq: Every day | ORAL | 0 refills | Status: AC

## 2017-09-02 NOTE — Progress Notes (Signed)
Jerome Gallegos 47 y.o.   Chief Complaint  Patient presents with  . Medication Refill    LAMISIL  . SKIN TAGS    on body    HISTORY OF PRESENT ILLNESS: This is a 47 y.o. male needs medication refill for Lamisil.  Taking it for fungal infection on his toes.  Has been taking it for 3 months.  No medication side effects.  Also has multiple skin tags on entire body.  Needs dermatology referral.  No other complaints or significant symptoms.  HPI   Prior to Admission medications   Medication Sig Start Date End Date Taking? Authorizing Provider  terbinafine (LAMISIL) 250 MG tablet TAKE 1 TABLET BY MOUTH EVERY DAY 07/06/17  Yes Mariateresa Batra, Eilleen Kempf, MD  cyclobenzaprine (FLEXERIL) 10 MG tablet Take 1 tablet (10 mg total) by mouth 3 (three) times daily as needed for muscle spasms. Patient not taking: Reported on 05/18/2017 04/17/17   Georgina Quint, MD  terbinafine (LAMISIL) 250 MG tablet Take 250 mg by mouth daily.    [provider]    No Known Allergies  Patient Active Problem List   Diagnosis Date Noted  . Acute left-sided thoracic back pain 04/17/2017  . Musculoskeletal back pain 04/17/2017  . Onychomycosis of toenail 04/17/2017    No past medical history on file.  No past surgical history on file.  Social History   Socioeconomic History  . Marital status: Married    Spouse name: Not on file  . Number of children: Not on file  . Years of education: Not on file  . Highest education level: Not on file  Social Needs  . Financial resource strain: Not on file  . Food insecurity - worry: Not on file  . Food insecurity - inability: Not on file  . Transportation needs - medical: Not on file  . Transportation needs - non-medical: Not on file  Occupational History  . Not on file  Tobacco Use  . Smoking status: Never Smoker  . Smokeless tobacco: Never Used  Substance and Sexual Activity  . Alcohol use: No  . Drug use: No  . Sexual activity: Not on file    Other Topics Concern  . Not on file  Social History Narrative  . Not on file    Family History  Problem Relation Age of Onset  . Diabetes Mother      Review of Systems  Constitutional: Negative.  Negative for chills, fever and malaise/fatigue.  HENT: Negative.  Negative for congestion, nosebleeds and sore throat.   Eyes: Negative for discharge and redness.  Respiratory: Negative for cough and shortness of breath.   Cardiovascular: Negative for chest pain and palpitations.  Gastrointestinal: Negative for abdominal pain, diarrhea, nausea and vomiting.  Genitourinary: Negative.   Musculoskeletal: Negative.   Skin: Negative.  Negative for rash.  Endo/Heme/Allergies: Negative.   All other systems reviewed and are negative.   Vitals:   09/02/17 0813  BP: 100/70  Pulse: 67  Resp: 16  Temp: 97.8 F (36.6 C)  SpO2: 98%    Physical Exam  Constitutional: He is oriented to person, place, and time. He appears well-developed and well-nourished.  HENT:  Head: Normocephalic and atraumatic.  Eyes: Conjunctivae and EOM are normal. Pupils are equal, round, and reactive to light.  Neck: Normal range of motion. Neck supple.  Cardiovascular: Normal rate and regular rhythm.  Pulmonary/Chest: Effort normal.  Musculoskeletal: Normal range of motion.  Positive toenail fungal infections.  Neurological: He is alert and  oriented to person, place, and time. No sensory deficit. He exhibits normal muscle tone.  Skin: Capillary refill takes less than 2 seconds.  Multiple dark colored skin tags on different areas.  Psychiatric: He has a normal mood and affect. His behavior is normal.  Vitals reviewed.   A total of 25 minutes was spent in the room with the patient, greater than 50% of which was in counseling/coordination of care .  ASSESSMENT & PLAN: Jerome Gallegos was seen today for medication refill and skin tags.  Diagnoses and all orders for this visit:  Skin tags, multiple acquired -      Ambulatory referral to Dermatology -     CBC with Differential/Platelet -     Comprehensive metabolic panel -     Lipid panel -     HIV antibody  Onychomycosis of toenail -     Ambulatory referral to Dermatology -     terbinafine (LAMISIL) 250 MG tablet; Take 1 tablet (250 mg total) by mouth daily. -     CBC with Differential/Platelet -     Comprehensive metabolic panel -     Lipid panel -     HIV antibody    Patient Instructions       IF you received an x-ray today, you will receive an invoice from [facility]. Please contact [facility] at [phone] with questions or concerns regarding your invoice.   IF you received labwork today, you will receive an invoice from [facility]. Please contact [facility] at [phone] with questions or concerns regarding your invoice.   Our billing staff will not be able to assist you with questions regarding bills from these companies.  You will be contacted with the lab results as soon as they are available. The fastest way to get your results is to activate your My Chart account. Instructions are located on the last page of this paperwork. If you have not heard from us regarding the results in 2 weeks, please contact this office.     Papiloma cutneo en los adultos Skin Tag, Adult Un papiloma cutneo (acrocordn) es un crecimiento extra de piel suave. La mayora de los papilomas cutneos tienen el mismo color de la piel y no son ms grandes que la goma de un lpiz. Por lo general, se forman en zonas donde hay pliegues de la piel, como la axila o la ingle. Los papilomas cutneos no son peligrosos y no se transmiten de Neomia Dearuna persona a Theodoro Clockotra (no son contagiosos). Puede tener un solo papiloma cutneo o varios. Los papilomas cutneos no requieren TEFL teachertratamiento. Sin embargo, el mdico puede recomendarle que se lo quite si:  Se irrita con el roce de la ropa.  Sangra.  Se ve y tiene un aspecto antiesttico.  El mdico puede quitar  el papiloma cutneo mediante un procedimiento quirrgico simple o mediante un procedimiento que implica el congelamiento del papiloma. Siga estas indicaciones en su casa:  Controle el papiloma para detectar cualquier cambio. Un papiloma cutneo normal no requiere ningn otro cuidado especial en el hogar.  Tome los medicamentos de venta libre y los recetados solamente como se lo haya indicado el mdico.  OceanographerConcurra a todas las visitas de control como se lo haya indicado el mdico. Esto es importante. Comunquese con un mdico si:  Tiene un papiloma cutneo que: ? Duele. ? Cambia de color. ? Sangra. ? Se hincha.  Le aparecen ms papilomas cutneos. Esta informacin no tiene Theme park managercomo fin reemplazar el consejo del mdico. Asegrese de hacerle al  mdico cualquier pregunta que tenga. Document Released: 11/19/2016 Document Revised: 11/19/2016 Document Reviewed: 07/22/2015 Elsevier Interactive Patient Education  2018 Elsevier Inc.  Infeccin por hongos en las uas (Fungal Nail Infection) La infeccin por hongos en las uas es una infeccin por hongos frecuente en las uas de los pies o de las manos. Este trastorno [employer] uas de los pies con ms frecuencia que las uas de las manos. Ms de Neomia Dear ua puede infectarse. Esta afeccin puede transmitirse de [place] persona a otra (es  contagiosa). CAUSAS La causa de esta afeccin es un hongo. Existen distintos tipos de hongos que pueden causar la infeccin. Estos hongos son ms frecuentes en las zonas hmedas y clidas. Si las manos o los pies entran en contacto con los hongos, se pueden introducir en una ruptura de las uas de las manos o de los pies y Games developer infeccin. FACTORES DE RIESGO Los siguientes factores pueden hacer que usted sea propenso a sufrir esta afeccin:  Ser varn.  Tener diabetes.  Ser Neomia Dear persona de edad avanzada.  Convivir con alguien que tiene hongos.  Caminar descalzo en zonas donde proliferan hongos, como duchas o  vestuarios.  Tener mala circulacin.  Usar zapatos y calcetines que [employer].  Tener pie de atleta.  Tener una ua lastimada o antecedentes recientes de una ciruga de uas.  Tener psoriasis.  Debilitamiento del sistema de defensa del cuerpo (sistema inmunitario). SNTOMAS Los sntomas de esta afeccin incluyen lo siguiente:  Cyndia Diver plida sobre la ua.  Engrosamiento de la ua.  Una ua que se torna amarilla o [place].  Bordes de las uas rugosos o quebradizos.  Una ua que se cae.  Una ua que se ha desprendido del lecho ungueal. DIAGNSTICO Esta afeccin se diagnostica mediante un examen fsico. El mdico podr tomar una muestra de la ua para examinarla y Engineer, manufacturing si tiene hongos. TRATAMIENTO Las infecciones leves no necesitan tratamiento. Si tiene [employer] uas, el tratamiento puede incluir lo siguiente:  Medicamentos antimicticos por va oral. Deber tomar los medicamentos durante algunas semanas o meses y no ver los resultados hasta despus de un largo [place]. Estos medicamentos pueden tener efectos secundarios. Consulte al [employer] a los que debe estar atento.  Cremas y esmaltes para uas antimicticos. Se pueden usar junto con los medicamentos antimicticos que se administran por va oral.  Tratamiento lser de las uas.  Ciruga para extirpar la ua. Esto puede ser [employer] casos ms graves de infecciones. El tratamiento es muy largo y la infeccin Metallurgist. INSTRUCCIONES PARA EL CUIDADO EN EL HOGAR Medicamentos  Tome o aplquese los medicamentos de venta libre y Science writer se lo haya indicado el mdico.  Consulte al mdico sobre el uso de pomadas mentoladas para las uas de English. Estilo de vida  No comparta elementos personales como toallas o cortauas.  Crtese las uas con frecuencia.  Lvese y squese las manos y los pies todos [place].  Use calcetines  absorbentes y cmbiese los calcetines con frecuencia.  Use un tipo de calzado que permita que el aire [place], como sandalias o zapatillas de lona. Deseche los zapatos viejos.  Use guantes de goma si est trabajando con sus manos en lugares mojados.  No camine descalzo en duchas o vestuarios.  No concurra a un saln de cosmtica de uas si no usan instrumentos limpios.  No use uas artificiales. Instrucciones generales  Concurra a todas las visitas de  control como se lo haya indicado el mdico. Esto es importante.  Aplquese polvo antimictico en los pies y en los zapatos. SOLICITE ATENCIN MDICA SI: La infeccin no mejora o si empeora despus de varios meses. Esta informacin no tiene Theme park manager el consejo del mdico. Asegrese de hacerle al mdico cualquier pregunta que tenga. Document Released: 04/16/2005 Document Revised: 10/29/2015 Document Reviewed: 01/08/2015 Elsevier Interactive Patient Education  2018 Elsevier Inc.      Edwina Barth, MD [facility]

## 2017-09-02 NOTE — Patient Instructions (Addendum)
IF you received an x-ray today, you will receive an invoice from [facility] Radiology. Please contact [facility] Radiology at [phone] with questions or concerns regarding your invoice.   IF you received labwork today, you will receive an invoice from [facility]. Please contact [facility] at [phone] with questions or concerns regarding your invoice.   Our billing staff will not be able to assist you with questions regarding bills from these companies.  You will be contacted with the lab results as soon as they are available. The fastest way to get your results is to activate your My Chart account. Instructions are located on the last page of this paperwork. If you have not heard from [place] regarding the results in 2 weeks, please contact this office.     Papiloma cutneo en los adultos Skin Tag, Adult Un papiloma cutneo (acrocordn) es un crecimiento extra de piel suave. La mayora de los papilomas cutneos tienen el mismo color de la piel y no son ms grandes que la goma de un lpiz. Por lo general, se forman en zonas donde hay pliegues de la piel, como la axila o la ingle. Los papilomas cutneos no son peligrosos y no se transmiten de Neomia Dear persona a Theodoro Clock (no son contagiosos). Puede tener un solo papiloma cutneo o varios. Los papilomas cutneos no requieren TEFL teacher. Sin embargo, el mdico puede recomendarle que se lo quite si:  Se irrita con el roce de la ropa.  Sangra.  Se ve y tiene un aspecto antiesttico.  El mdico puede quitar el papiloma cutneo mediante un procedimiento quirrgico simple o mediante un procedimiento que implica el congelamiento del papiloma. Siga estas indicaciones en su casa:  Controle el papiloma para detectar cualquier cambio. Un papiloma cutneo normal no requiere ningn otro cuidado especial en el hogar.  Tome los medicamentos de venta libre y los recetados solamente como se lo haya indicado el mdico.  Oceanographer a todas las visitas de control  como se lo haya indicado el mdico. Esto es importante. Comunquese con un mdico si:  Tiene un papiloma cutneo que: ? Duele. ? Cambia de color. ? Sangra. ? Se hincha.  Le aparecen ms papilomas cutneos. Esta informacin no tiene Theme park manager el consejo del mdico. Asegrese de hacerle al mdico cualquier pregunta que tenga. Document Released: 11/19/2016 Document Revised: 11/19/2016 Document Reviewed: 07/22/2015 Elsevier Interactive Patient Education  2018 Elsevier Inc.  Infeccin por hongos en las uas (Fungal Nail Infection) La infeccin por hongos en las uas es una infeccin por hongos frecuente en las uas de los pies o de las manos. Este trastorno [employer] uas de los pies con ms frecuencia que las uas de las manos. Ms de Neomia Dear ua puede infectarse. Esta afeccin puede transmitirse de [place] persona a otra (es  contagiosa). CAUSAS La causa de esta afeccin es un hongo. Existen distintos tipos de hongos que pueden causar la infeccin. Estos hongos son ms frecuentes en las zonas hmedas y clidas. Si las manos o los pies entran en contacto con los hongos, se pueden introducir en una ruptura de las uas de las manos o de los pies y Games developer infeccin. FACTORES DE RIESGO Los siguientes factores pueden hacer que usted sea propenso a sufrir esta afeccin:  Ser varn.  Tener diabetes.  Ser Neomia Dear persona de edad avanzada.  Convivir con alguien que tiene hongos.  Caminar descalzo en zonas donde proliferan hongos, como duchas o vestuarios.  Tener mala circulacin.  Usar zapatos y calcetines que [employer]  transpirar los pies.  Tener pie de atleta.  Tener una ua lastimada o antecedentes recientes de una ciruga de uas.  Tener psoriasis.  Debilitamiento del sistema de defensa del cuerpo (sistema inmunitario). SNTOMAS Los sntomas de esta afeccin incluyen lo siguiente:  Cyndia DiverUna mancha plida sobre la ua.  Engrosamiento de la ua.  Una ua que se torna amarilla o  [place].  Bordes de las uas rugosos o quebradizos.  Una ua que se cae.  Una ua que se ha desprendido del lecho ungueal. DIAGNSTICO Esta afeccin se diagnostica mediante un examen fsico. El mdico podr tomar una muestra de la ua para examinarla y Engineer, manufacturingdetectar si tiene hongos. TRATAMIENTO Las infecciones leves no necesitan tratamiento. Si tiene Charter Communicationscambios importantes en las uas, el tratamiento puede incluir lo siguiente:  Medicamentos antimicticos por va oral. Deber tomar los medicamentos durante algunas semanas o meses y no ver los resultados hasta despus de un largo Marcellinetiempo. Estos medicamentos pueden tener efectos secundarios. Consulte al Dow Chemicalmdico sobre los problemas a los que debe estar atento.  Cremas y esmaltes para uas antimicticos. Se pueden usar junto con los medicamentos antimicticos que se administran por va oral.  Tratamiento lser de las uas.  Ciruga para extirpar la ua. Esto puede ser Foot Lockernecesario en los casos ms graves de infecciones. El tratamiento es muy largo y la infeccin Metallurgistpuede reaparecer. INSTRUCCIONES PARA EL CUIDADO EN EL HOGAR Medicamentos  Tome o aplquese los medicamentos de venta libre y Science writerrecetados solamente como se lo haya indicado el mdico.  Consulte al mdico sobre el uso de pomadas mentoladas para las uas de Inmanventa libre. Estilo de vida  No comparta elementos personales como toallas o cortauas.  Crtese las uas con frecuencia.  Lvese y squese las manos y los pies todos White Houselos das.  Use calcetines absorbentes y cmbiese los calcetines con frecuencia.  Use un tipo de calzado que permita que el aire Montgomerycircule, como sandalias o zapatillas de lona. Deseche los zapatos viejos.  Use guantes de goma si est trabajando con sus manos en lugares mojados.  No camine descalzo en duchas o vestuarios.  No concurra a un saln de cosmtica de uas si no usan instrumentos limpios.  No use uas artificiales. Instrucciones generales  Concurra a todas las  visitas de control como se lo haya indicado el mdico. Esto es importante.  Aplquese polvo antimictico en los pies y en los zapatos. SOLICITE ATENCIN MDICA SI: La infeccin no mejora o si empeora despus de varios meses. Esta informacin no tiene Theme park managercomo fin reemplazar el consejo del mdico. Asegrese de hacerle al mdico cualquier pregunta que tenga. Document Released: 04/16/2005 Document Revised: 10/29/2015 Document Reviewed: 01/08/2015 Elsevier Interactive Patient Education  [employer]2018 Elsevier Inc.

## 2017-09-03 LAB — CBC WITH DIFFERENTIAL/PLATELET
BASOS ABS: 0 10*3/uL (ref 0.0–0.2)
Basos: 0 %
EOS (ABSOLUTE): 0.1 10*3/uL (ref 0.0–0.4)
Eos: 1 %
HEMOGLOBIN: 15.3 g/dL (ref 13.0–17.7)
Hematocrit: 44.5 % (ref 37.5–51.0)
Immature Grans (Abs): 0 10*3/uL (ref 0.0–0.1)
Immature Granulocytes: 0 %
Lymphocytes Absolute: 1.2 10*3/uL (ref 0.7–3.1)
Lymphs: 22 %
MCH: 31.2 pg (ref 26.6–33.0)
MCHC: 34.4 g/dL (ref 31.5–35.7)
MCV: 91 fL (ref 79–97)
MONOCYTES: 8 %
Monocytes Absolute: 0.4 10*3/uL (ref 0.1–0.9)
NEUTROS ABS: 3.9 10*3/uL (ref 1.4–7.0)
Neutrophils: 69 %
Platelets: 212 10*3/uL (ref 150–379)
RBC: 4.9 x10E6/uL (ref 4.14–5.80)
RDW: 13.5 % (ref 12.3–15.4)
WBC: 5.6 10*3/uL (ref 3.4–10.8)

## 2017-09-03 LAB — COMPREHENSIVE METABOLIC PANEL
ALBUMIN: 4.2 g/dL (ref 3.5–5.5)
ALT: 23 IU/L (ref 0–44)
AST: 17 IU/L (ref 0–40)
Albumin/Globulin Ratio: 1.4 (ref 1.2–2.2)
Alkaline Phosphatase: 82 IU/L (ref 39–117)
BUN / CREAT RATIO: 13 (ref 9–20)
BUN: 12 mg/dL (ref 6–24)
Bilirubin Total: 0.4 mg/dL (ref 0.0–1.2)
CO2: 24 mmol/L (ref 20–29)
CREATININE: 0.93 mg/dL (ref 0.76–1.27)
Calcium: 9.1 mg/dL (ref 8.7–10.2)
Chloride: 99 mmol/L (ref 96–106)
GFR calc non Af Amer: 97 mL/min/{1.73_m2} (ref >59)
GFR, EST AFRICAN AMERICAN: 113 mL/min/{1.73_m2} (ref >59)
GLUCOSE: 104 mg/dL — AB (ref 65–99)
Globulin, Total: 2.9 g/dL (ref 1.5–4.5)
Potassium: 4.5 mmol/L (ref 3.5–5.2)
Sodium: 138 mmol/L (ref 134–144)
TOTAL PROTEIN: 7.1 g/dL (ref 6.0–8.5)

## 2017-09-03 LAB — LIPID PANEL
CHOL/HDL RATIO: 5.4 ratio — AB (ref 0.0–5.0)
CHOLESTEROL TOTAL: 200 mg/dL — AB (ref 100–199)
HDL: 37 mg/dL — ABNORMAL LOW (ref >39)
LDL Calculated: 110 mg/dL — ABNORMAL HIGH (ref 0–99)
Triglycerides: 265 mg/dL — ABNORMAL HIGH (ref 0–149)
VLDL Cholesterol Cal: 53 mg/dL — ABNORMAL HIGH (ref 5–40)

## 2017-09-03 LAB — HIV ANTIBODY (ROUTINE TESTING W REFLEX): HIV SCREEN 4TH GENERATION: NONREACTIVE

## 2020-08-18 ENCOUNTER — Emergency Department (HOSPITAL_COMMUNITY)
Admission: EM | Admit: 2020-08-18 | Discharge: 2020-08-18 | Disposition: A | Discharge status: Home / Self Care | Payer: 59 | Attending: Emergency Medicine | Admitting: Emergency Medicine

## 2020-08-18 ENCOUNTER — Encounter (HOSPITAL_COMMUNITY): Payer: Self-pay | Admitting: Emergency Medicine

## 2020-08-18 ENCOUNTER — Emergency Department (HOSPITAL_COMMUNITY): Payer: 59

## 2020-08-18 ENCOUNTER — Other Ambulatory Visit: Payer: Self-pay

## 2020-08-18 DIAGNOSIS — M542 Cervicalgia: Secondary | ICD-10-CM | POA: Diagnosis not present

## 2020-08-18 DIAGNOSIS — J0100 Acute maxillary sinusitis, unspecified: Secondary | ICD-10-CM | POA: Diagnosis not present

## 2020-08-18 DIAGNOSIS — Y9301 Activity, walking, marching and hiking: Secondary | ICD-10-CM | POA: Insufficient documentation

## 2020-08-18 DIAGNOSIS — W000XXA Fall on same level due to ice and snow, initial encounter: Secondary | ICD-10-CM | POA: Diagnosis not present

## 2020-08-18 DIAGNOSIS — M549 Dorsalgia, unspecified: Secondary | ICD-10-CM | POA: Insufficient documentation

## 2020-08-18 DIAGNOSIS — S0990XA Unspecified injury of head, initial encounter: Secondary | ICD-10-CM | POA: Diagnosis not present

## 2020-08-18 DIAGNOSIS — J320 Chronic maxillary sinusitis: Secondary | ICD-10-CM

## 2020-08-18 MED ORDER — ACETAMINOPHEN 325 MG PO TABS
650.0000 mg | ORAL_TABLET | Freq: Once | ORAL | Status: DC
  Filled 2020-08-18: qty 2

## 2020-08-18 NOTE — Discharge Instructions (Addendum)
Call your primary care doctor or specialist as discussed in the next 2-3 days.   Return immediately back to the ER if:  Your symptoms worsen within the next 12-24 hours. You develop new symptoms such as new fevers, persistent vomiting, new pain, shortness of breath, or new weakness or numbness, or if you have any other concerns.  

## 2020-08-18 NOTE — ED Provider Notes (Signed)
[facility]-EMERGENCY DEPT Provider Note   CSN: [id number] Arrival date & time: 08/18/20  1600     History Chief Complaint  Patient presents with  . Fall  . head pressure  . Back Pain    Jerome Gallegos is a 50 y.o. male.  Patient presents ER chief complaint of fall, head injury and sinus drainage. He states he was walking his dog when he slipped on the ice this morning around 9 AM and fell backwards. States he hit his head on the ground and since then has not felt "right." Complaining of severe head and neck pain. Later in the afternoon he states that he noticed some yellowish-brownish discharge from the right nostril intermittently throughout the day. Otherwise denies fevers or cough or vomiting or diarrhea.        History reviewed. No pertinent past medical history.  Patient Active Problem List   Diagnosis Date Noted  . Skin tags, multiple acquired 09/02/2017  . Acute left-sided thoracic back pain 04/17/2017  . Musculoskeletal back pain 04/17/2017  . Onychomycosis of toenail 04/17/2017    History reviewed. No pertinent surgical history.     Family History  Problem Relation Age of Onset  . Diabetes Mother     Social History   Tobacco Use  . Smoking status: Never Smoker  . Smokeless tobacco: Never Used  Substance Use Topics  . Alcohol use: No  . Drug use: No    Home Medications Prior to Admission medications   Medication Sig Start Date End Date Taking? Authorizing Provider  cyclobenzaprine (FLEXERIL) 10 MG tablet Take 1 tablet (10 mg total) by mouth 3 (three) times daily as needed for muscle spasms. Patient not taking: Reported on 05/18/2017 04/17/17   Georgina Quint, MD  terbinafine (LAMISIL) 250 MG tablet Take 250 mg by mouth daily.    [provider]  terbinafine (LAMISIL) 250 MG tablet TAKE 1 TABLET BY MOUTH EVERY DAY 07/06/17   Georgina Quint, MD    Allergies    Patient has no known allergies.  Review  of Systems   Review of Systems  Constitutional: Negative for fever.  HENT: Negative for ear pain and sore throat.   Eyes: Negative for pain.  Respiratory: Negative for cough.   Cardiovascular: Negative for chest pain.  Gastrointestinal: Negative for abdominal pain.  Genitourinary: Negative for flank pain.  Musculoskeletal: Negative for back pain.  Skin: Negative for color change and rash.  Neurological: Positive for headaches. Negative for syncope.  All other systems reviewed and are negative.   Physical Exam Updated Vital Signs BP (!) 155/92 (BP Location: Right Arm)   Pulse 60   Temp 98.1 F (36.7 C) (Oral)   Resp 16   Ht 5\' 10"  (1.778 m)   Wt 100 kg   SpO2 100%   BMI 31.63 kg/m   Physical Exam Constitutional:      General: He is not in acute distress.    Appearance: He is well-developed.  HENT:     Head: Normocephalic.     Comments: No raccoon eyes. No hemotympanum. No evidence of facial or head trauma otherwise.    Nose: Nose normal.     Comments: No nasal discharge or dripping noticed when the patient is leaning forward or standing up. Eyes:     Extraocular Movements: Extraocular movements intact.  Cardiovascular:     Rate and Rhythm: Normal rate.  Pulmonary:     Effort: Pulmonary effort is normal.  Skin:  Coloration: Skin is not jaundiced.  Neurological:     General: No focal deficit present.     Mental Status: He is alert and oriented to person, place, and time. Mental status is at baseline.     Cranial Nerves: No cranial nerve deficit.     Motor: No weakness.     Gait: Gait normal.     ED Results / Procedures / Treatments   Labs (all labs ordered are listed, but only abnormal results are displayed) Labs Reviewed - No data to display  EKG None  Radiology CT Head Wo Contrast  Result Date: 08/18/2020 CLINICAL DATA:  Headache and neck pain after falling and hitting his head today. EXAM: CT HEAD WITHOUT CONTRAST CT CERVICAL SPINE WITHOUT CONTRAST  TECHNIQUE: Multidetector CT imaging of the head and cervical spine was performed following the standard protocol without intravenous contrast. Multiplanar CT image reconstructions of the cervical spine were also generated. COMPARISON:  None. FINDINGS: CT HEAD FINDINGS Brain: Normal appearing cerebral hemispheres and posterior fossa structures. Normal size and position of the ventricles. No intracranial hemorrhage, mass lesion or CT evidence of acute infarction. Vascular: No hyperdense vessel or unexpected calcification. Skull: Normal. Negative for fracture or focal lesion. Sinuses/Orbits: Small amount of fluid in the right maxillary sinus. Small bilateral maxillary sinus retention cysts. Unremarkable orbits. Other: None. CT CERVICAL SPINE FINDINGS Alignment: Normal. Skull base and vertebrae: No acute fracture. No primary bone lesion or focal pathologic process. Soft tissues and spinal canal: No prevertebral fluid or swelling. No visible canal hematoma. Disc levels: Mild posterior spur formation at the C4-5 and C5-6 levels and minimal posterior spur formation at the C6-7 level. Upper chest: Minimal bilateral upper lobe dependent atelectasis. Other: None. IMPRESSION: 1. No skull fracture or intracranial hemorrhage. 2. No cervical spine fracture or subluxation. 3. Mild cervical spine degenerative changes. 4. Small amount of fluid in the right maxillary sinus. This can be seen with acute sinusitis. Blood in the sinus is less likely in the absence of a visible maxillofacial fracture. Electronically Signed   By: Beckie Salts M.D.   On: 08/18/2020 16:59   CT Cervical Spine Wo Contrast  Result Date: 08/18/2020 CLINICAL DATA:  Headache and neck pain after falling and hitting his head today. EXAM: CT HEAD WITHOUT CONTRAST CT CERVICAL SPINE WITHOUT CONTRAST TECHNIQUE: Multidetector CT imaging of the head and cervical spine was performed following the standard protocol without intravenous contrast. Multiplanar CT image  reconstructions of the cervical spine were also generated. COMPARISON:  None. FINDINGS: CT HEAD FINDINGS Brain: Normal appearing cerebral hemispheres and posterior fossa structures. Normal size and position of the ventricles. No intracranial hemorrhage, mass lesion or CT evidence of acute infarction. Vascular: No hyperdense vessel or unexpected calcification. Skull: Normal. Negative for fracture or focal lesion. Sinuses/Orbits: Small amount of fluid in the right maxillary sinus. Small bilateral maxillary sinus retention cysts. Unremarkable orbits. Other: None. CT CERVICAL SPINE FINDINGS Alignment: Normal. Skull base and vertebrae: No acute fracture. No primary bone lesion or focal pathologic process. Soft tissues and spinal canal: No prevertebral fluid or swelling. No visible canal hematoma. Disc levels: Mild posterior spur formation at the C4-5 and C5-6 levels and minimal posterior spur formation at the C6-7 level. Upper chest: Minimal bilateral upper lobe dependent atelectasis. Other: None. IMPRESSION: 1. No skull fracture or intracranial hemorrhage. 2. No cervical spine fracture or subluxation. 3. Mild cervical spine degenerative changes. 4. Small amount of fluid in the right maxillary sinus. This can be seen with acute  sinusitis. Blood in the sinus is less likely in the absence of a visible maxillofacial fracture. Electronically Signed   By: Beckie Salts M.D.   On: 08/18/2020 16:59    Procedures Procedures   Medications Ordered in ED Medications  acetaminophen (TYLENOL) tablet 650 mg (650 mg Oral Patient Refused/Not Given 08/18/20 1707)    ED Course  I have reviewed the triage vital signs and the nursing notes.  Pertinent labs & imaging results that were available during my care of the patient were reviewed by me and considered in my medical decision making (see chart for details).    MDM Rules/Calculators/A&P                          Patient complains of severe pain after trauma, imaging was  pursued. No acute findings other than a small amount of fluid in the right maxillary sinus.  Will advise outpatient follow-up with his doctor within the next 4 to 5 days. No antibiotics given his his sinusitis symptoms only been going on for 1 day. Recommending immediate return for worsening symptoms fevers cough worsening pain or any additional concerns.   Final Clinical Impression(s) / ED Diagnoses Final diagnoses:  Closed head injury, initial encounter  Maxillary sinusitis, unspecified chronicity    Rx / DC Orders ED Discharge Orders    None       Cheryll Cockayne, MD 08/18/20 [phone]

## 2020-08-18 NOTE — ED Triage Notes (Addendum)
Patient states he slipped and fell on ice today around 0900, had R upper back pain and neck / head pressure. Hit his head, denies LOC. States around 1200 he leaned over and had clear yellow/brown drainage come from his nose, denies nasal congestion at this time. Had head pressure with bending down.

## 2022-04-03 IMAGING — CT CT CERVICAL SPINE W/O CM
3 of 4 series · 13 of 33 positions shown, 16 images · non-contrast
Comparison: None.

CLINICAL DATA: Headache and neck pain after falling and hitting his
head today.

EXAM:
CT HEAD WITHOUT CONTRAST
CT CERVICAL SPINE WITHOUT CONTRAST
TECHNIQUE: Multidetector CT imaging of the head and cervical spine was
performed following the standard protocol without intravenous
contrast. Multiplanar CT image reconstructions of the cervical spine
were also generated.

[Series 7: orthogonal bone · axial · 0.23mm/px · z∈[+1412,+1545]mm · 5 of 104 slices shown, 7 images]
[im 18/104  soft-tissue]
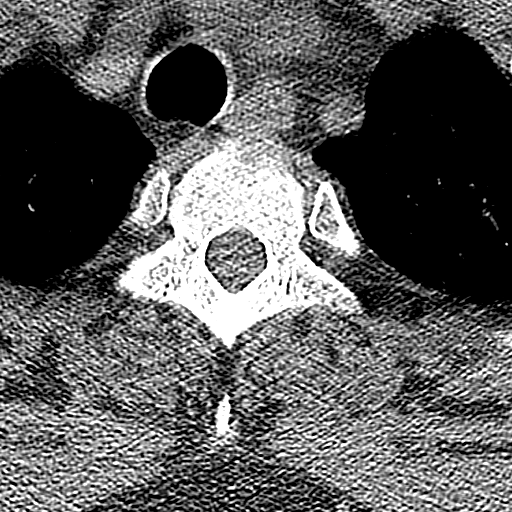
[im 18/104  bone]
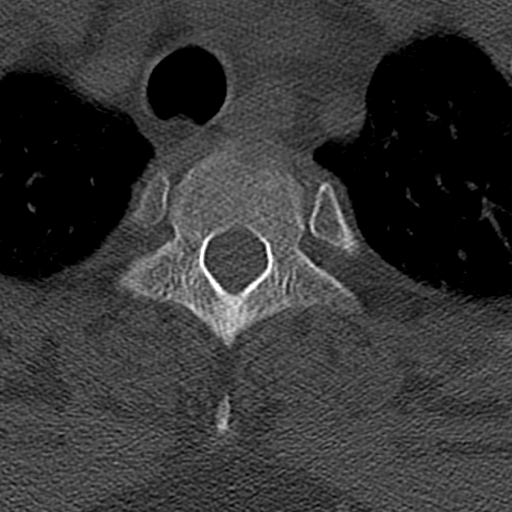
[im 35/104  bone]
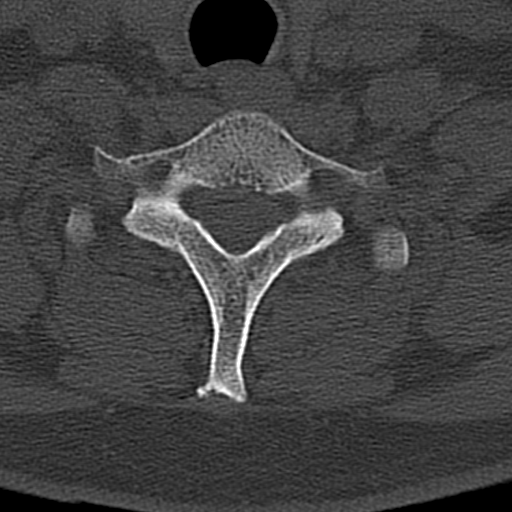
[im 52/104  bone]
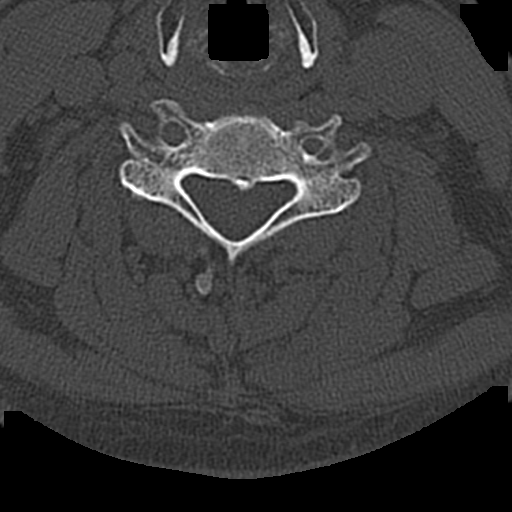
[im 69/104  bone]
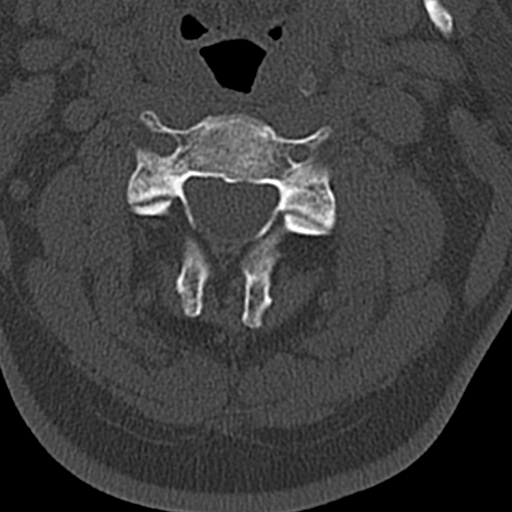
[im 86/104  soft-tissue]
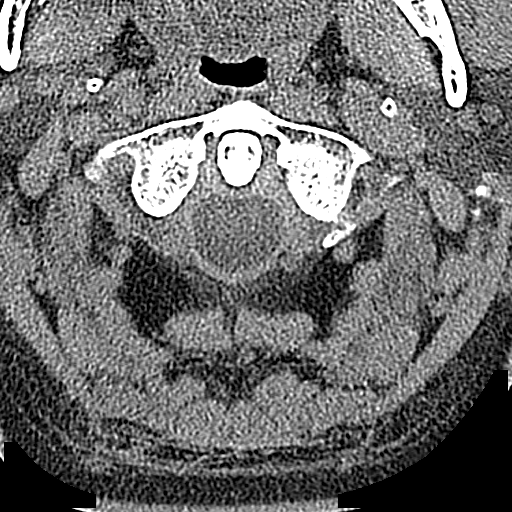
[im 86/104  bone]
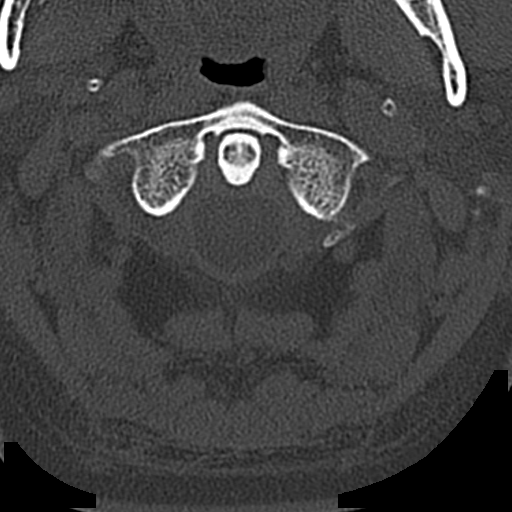

[Series 8: coronal bone · coronal · 0.31mm/px · 3 of 61 slices shown]
[im 13/61  bone]
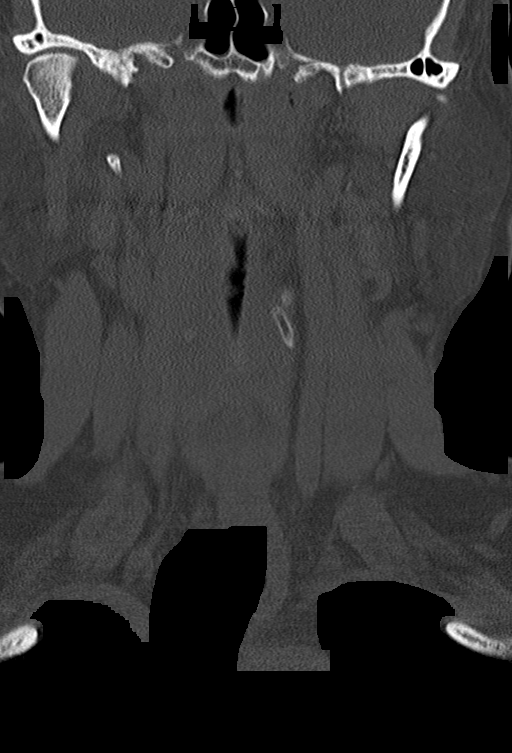
[im 25/61  bone]
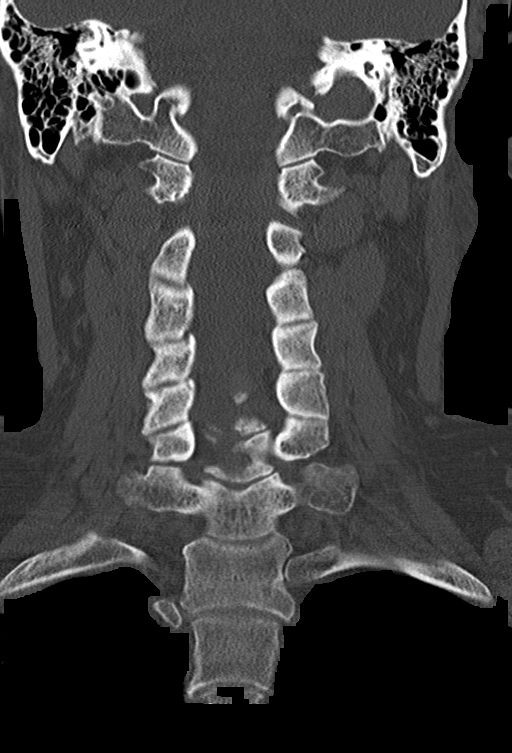
[im 37/61  bone]
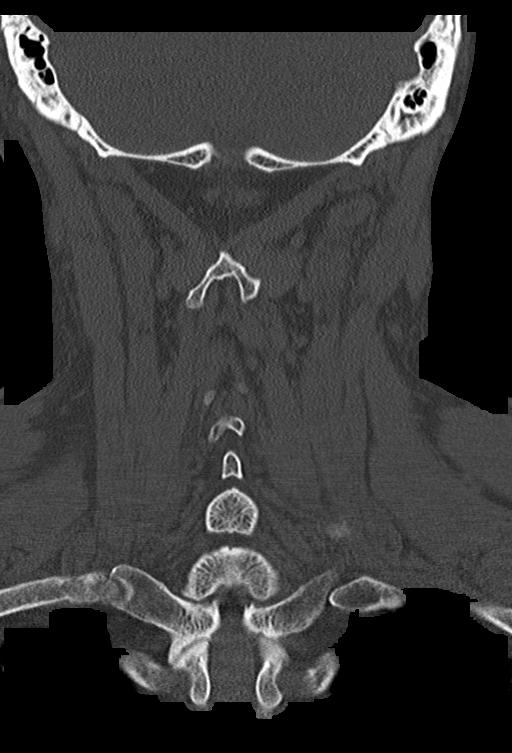

[Series 9: sagittal bone · sagittal · 0.31mm/px · 5 of 61 slices shown, 6 images]
[im 21/61  bone]
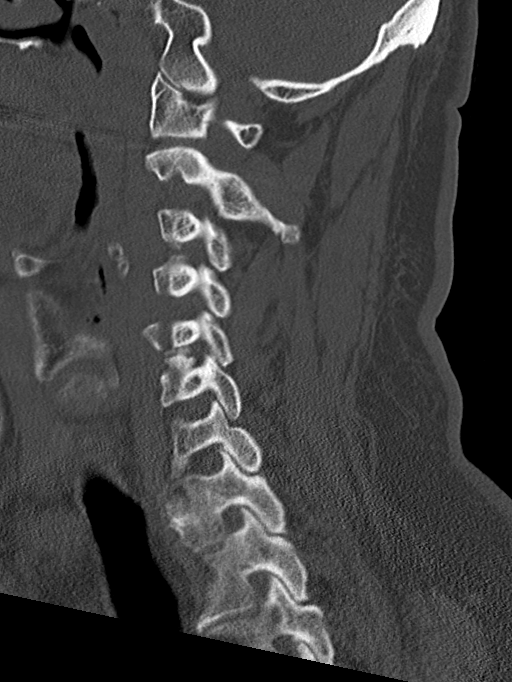
[im 26/61  bone]
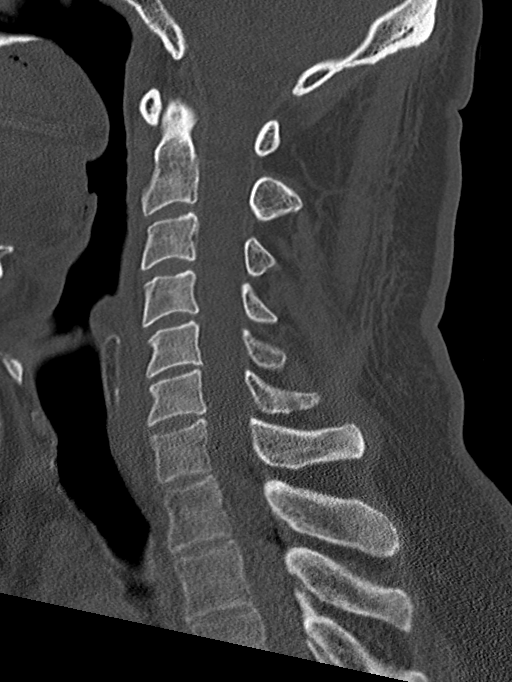
[im 31/61  soft-tissue]
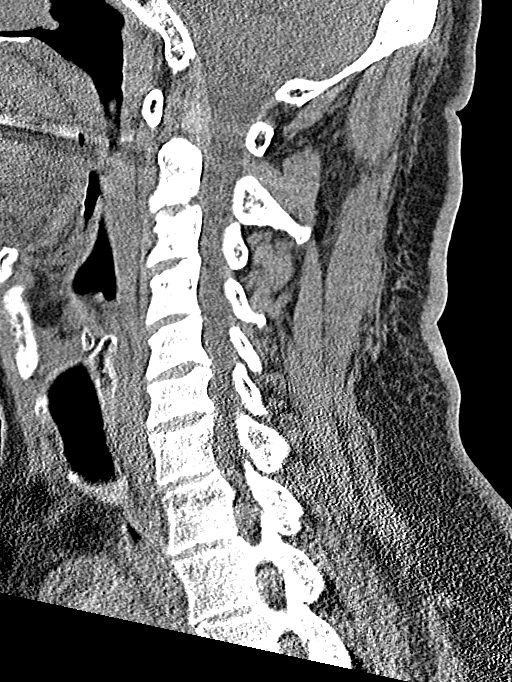
[im 31/61  bone]
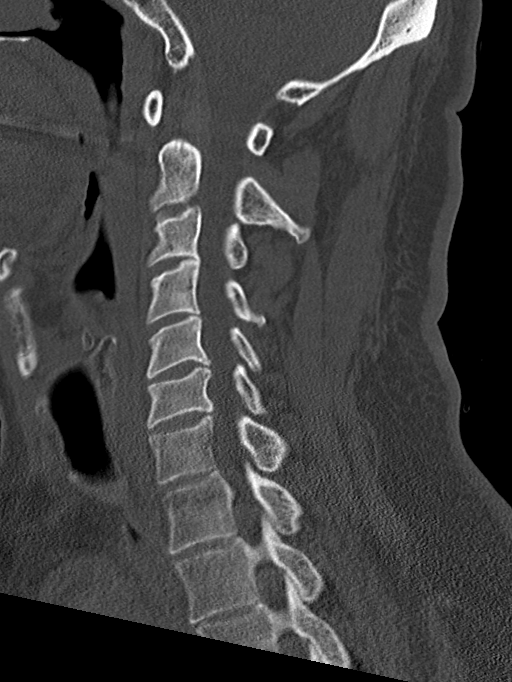
[im 36/61  bone]
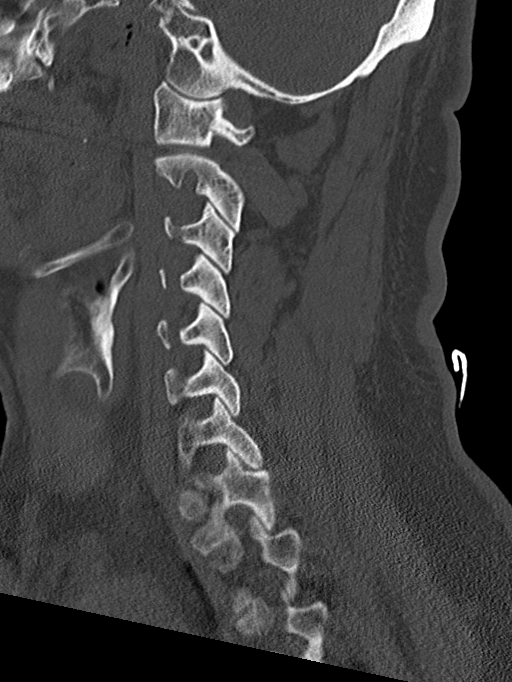
[im 41/61  bone]
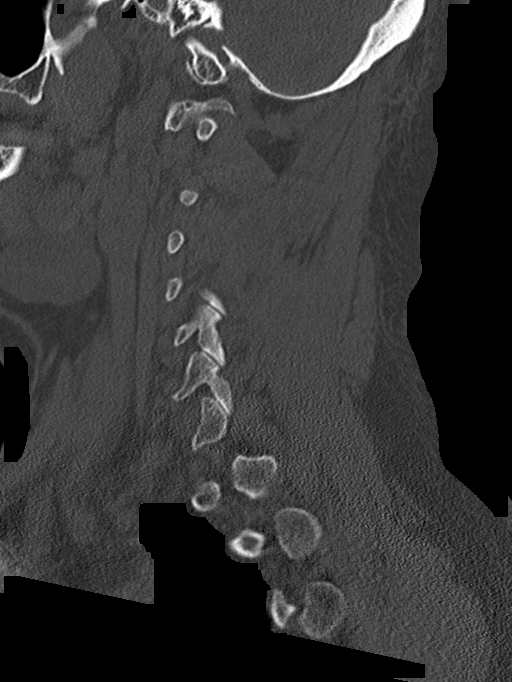

[13 of 33 positions shown; findings below may reference images not displayed]

FINDINGS: CT HEAD FINDINGS

Brain: Normal appearing cerebral hemispheres and posterior fossa
structures. Normal size and position of the ventricles. No
intracranial hemorrhage, mass lesion or CT evidence of acute
infarction.

Vascular: No hyperdense vessel or unexpected calcification.

Skull: Normal. Negative for fracture or focal lesion.

Sinuses/Orbits: Small amount of fluid in the right maxillary sinus.
Small bilateral maxillary sinus retention cysts. Unremarkable
orbits.

Other: None.

CT CERVICAL SPINE FINDINGS

Alignment: Normal.

Skull base and vertebrae: No acute fracture. No primary bone lesion
or focal pathologic process.

Soft tissues and spinal canal: No prevertebral fluid or swelling. No
visible canal hematoma.

Disc levels: Mild posterior spur formation at the C4-5 and C5-6
levels and minimal posterior spur formation at the C6-7 level.

Upper chest: Minimal bilateral upper lobe dependent atelectasis.

Other: None.
IMPRESSION: 1. No skull fracture or intracranial hemorrhage.
2. No cervical spine fracture or subluxation.
3. Mild cervical spine degenerative changes.
4. Small amount of fluid in the right maxillary sinus. This can be
seen with acute sinusitis. Blood in the sinus is less likely in the
absence of a visible maxillofacial fracture.

## 2022-04-03 IMAGING — CT CT HEAD W/O CM
3 series · 14 of 47 positions shown, 16 images · non-contrast
Comparison: None.

CLINICAL DATA: Headache and neck pain after falling and hitting his
head today.

EXAM:
CT HEAD WITHOUT CONTRAST
CT CERVICAL SPINE WITHOUT CONTRAST
TECHNIQUE: Multidetector CT imaging of the head and cervical spine was
performed following the standard protocol without intravenous
contrast. Multiplanar CT image reconstructions of the cervical spine
were also generated.

[Series 2: head wo · axial · 0.47mm/px · z∈[+1588,+1718]mm · 8 of 32 slices shown, 10 images]
[im 3/32  brain]
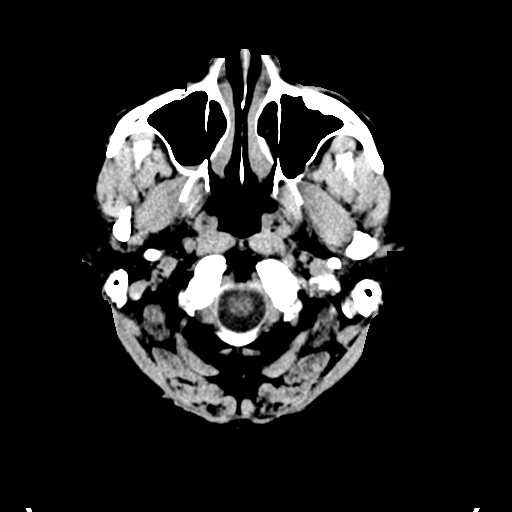
[im 3/32  bone]
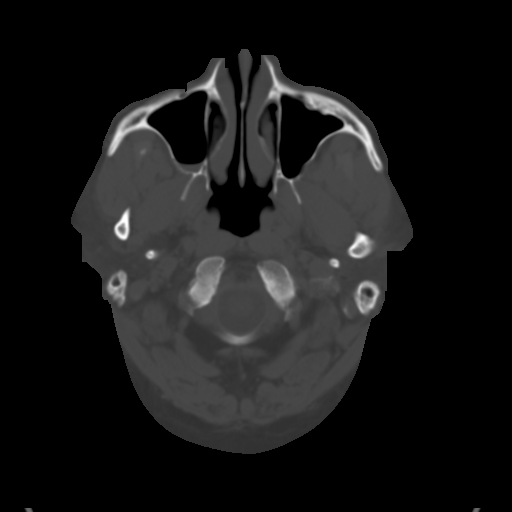
[im 7/32  brain]
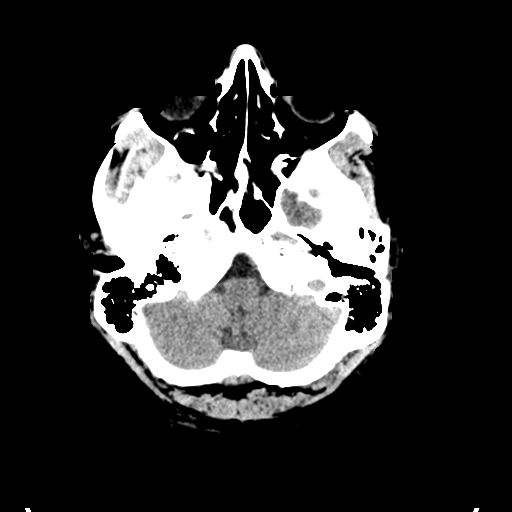
[im 10/32  brain]
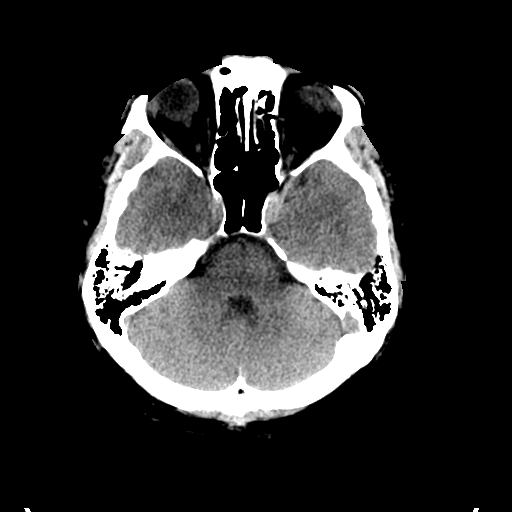
[im 14/32  brain]
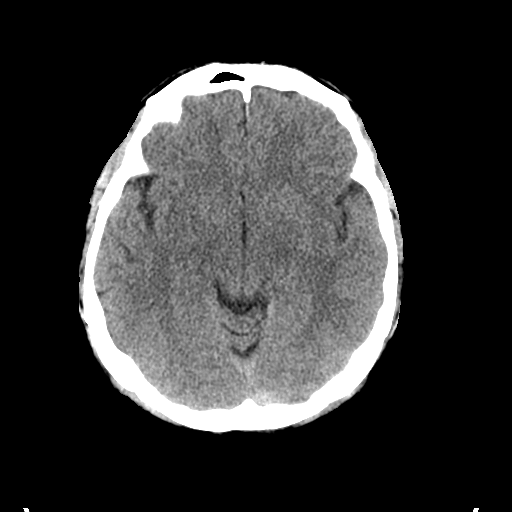
[im 18/32  brain]
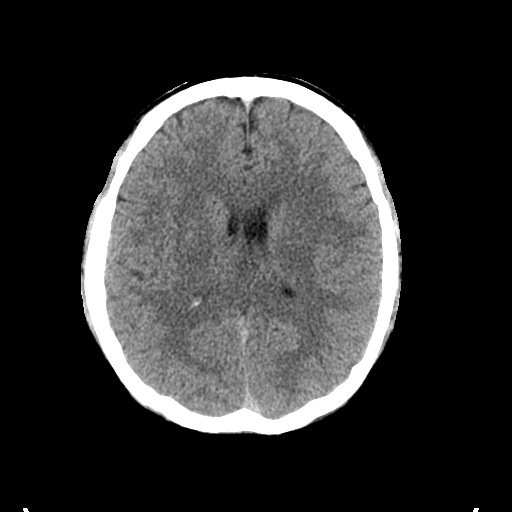
[im 18/32  bone]
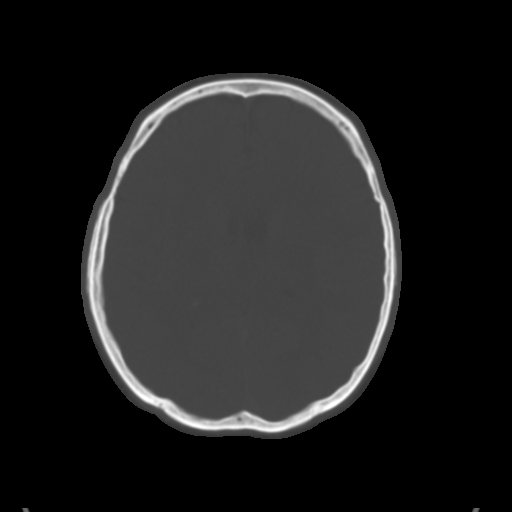
[im 22/32  brain]
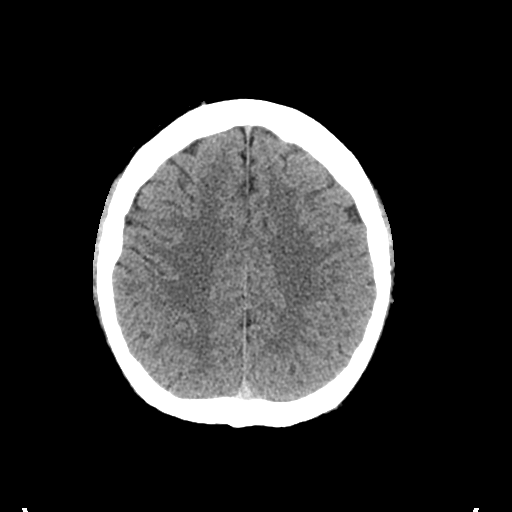
[im 25/32  brain]
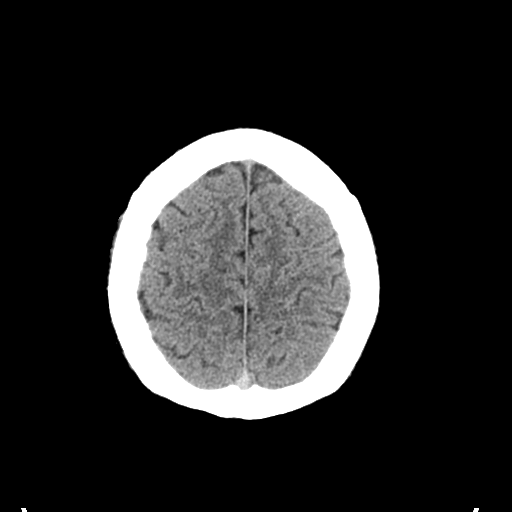
[im 29/32  brain]
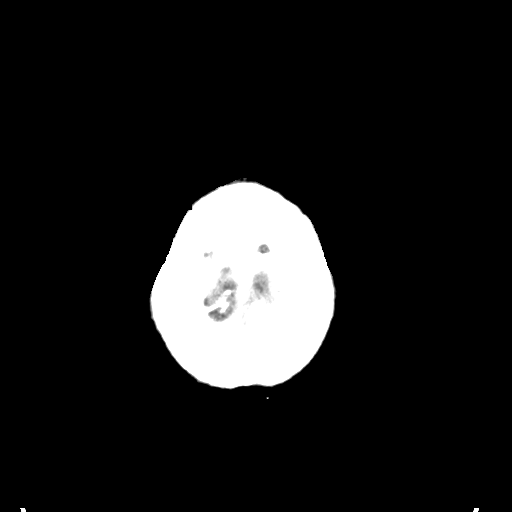

[Series 5: coronal soft tissue · coronal · 0.35mm/px · 3 of 68 slices shown]
[im 23/68  brain]
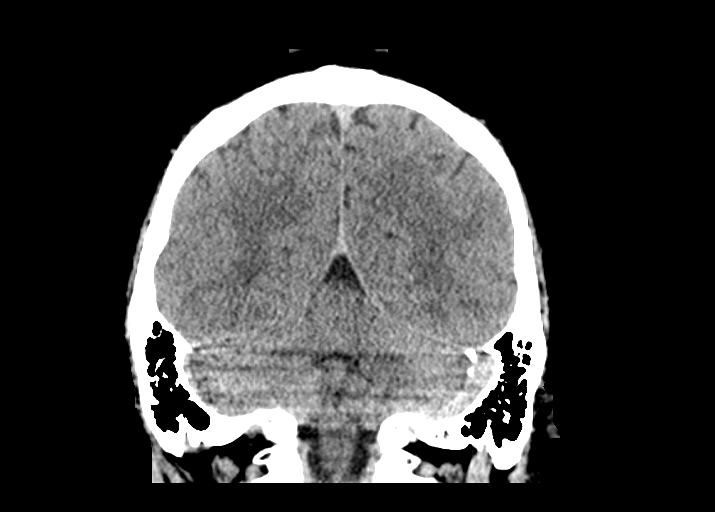
[im 30/68  brain]
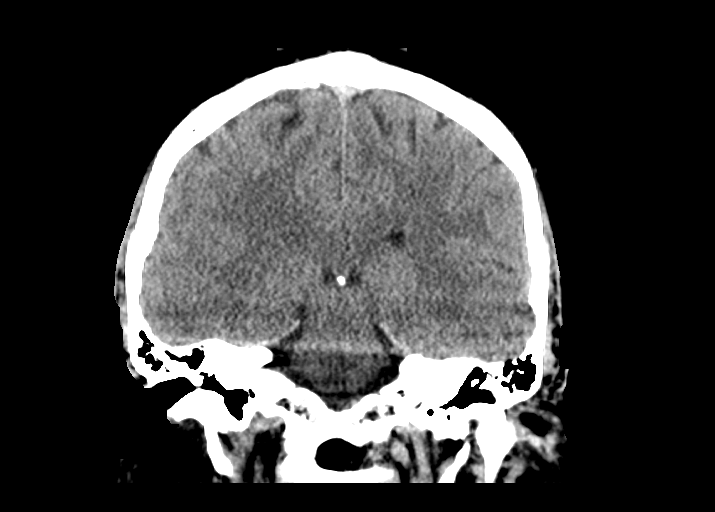
[im 38/68  brain]
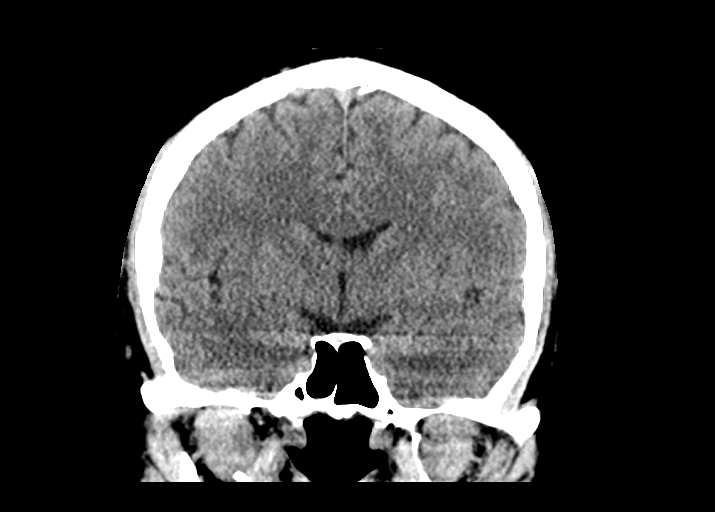

[Series 6: sagittal soft tissue · sagittal · 0.35mm/px · 3 of 59 slices shown]
[im 20/59  brain]
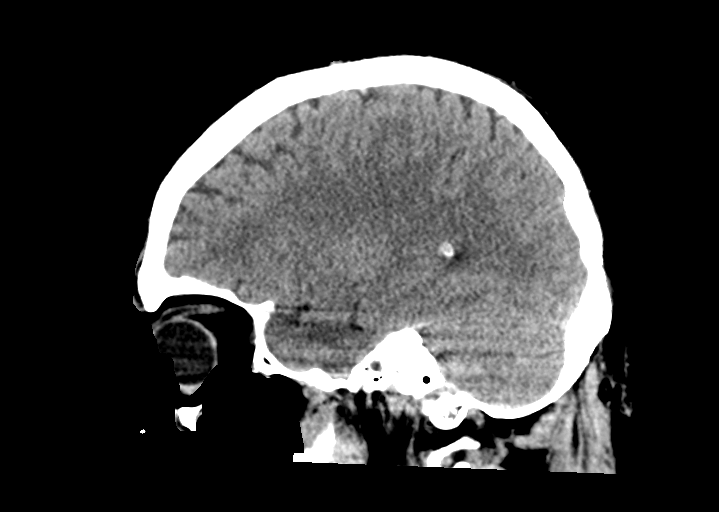
[im 30/59  brain]
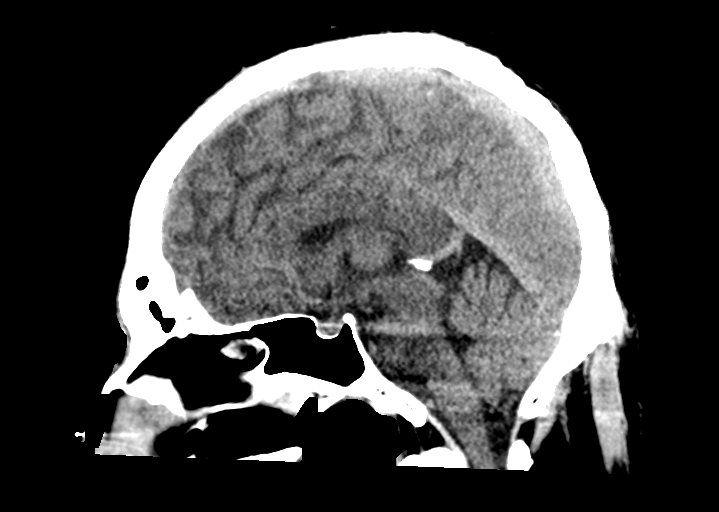
[im 39/59  brain]
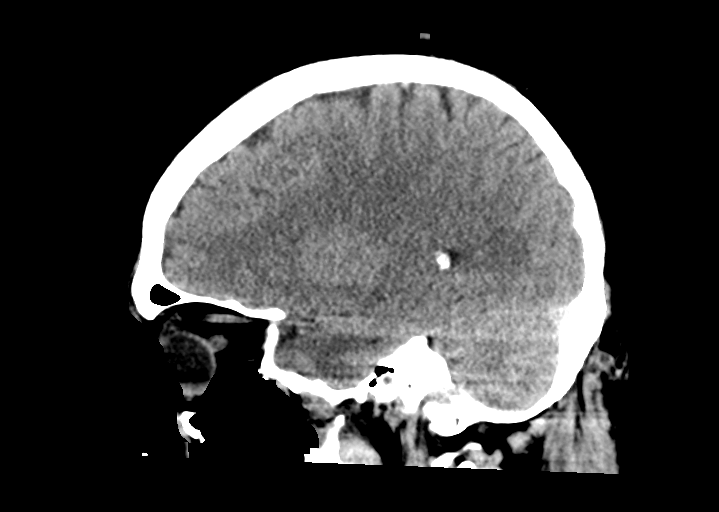

[14 of 47 positions shown; findings below may reference images not displayed]

FINDINGS: CT HEAD FINDINGS

Brain: Normal appearing cerebral hemispheres and posterior fossa
structures. Normal size and position of the ventricles. No
intracranial hemorrhage, mass lesion or CT evidence of acute
infarction.

Vascular: No hyperdense vessel or unexpected calcification.

Skull: Normal. Negative for fracture or focal lesion.

Sinuses/Orbits: Small amount of fluid in the right maxillary sinus.
Small bilateral maxillary sinus retention cysts. Unremarkable
orbits.

Other: None.

CT CERVICAL SPINE FINDINGS

Alignment: Normal.

Skull base and vertebrae: No acute fracture. No primary bone lesion
or focal pathologic process.

Soft tissues and spinal canal: No prevertebral fluid or swelling. No
visible canal hematoma.

Disc levels: Mild posterior spur formation at the C4-5 and C5-6
levels and minimal posterior spur formation at the C6-7 level.

Upper chest: Minimal bilateral upper lobe dependent atelectasis.

Other: None.
IMPRESSION: 1. No skull fracture or intracranial hemorrhage.
2. No cervical spine fracture or subluxation.
3. Mild cervical spine degenerative changes.
4. Small amount of fluid in the right maxillary sinus. This can be
seen with acute sinusitis. Blood in the sinus is less likely in the
absence of a visible maxillofacial fracture.
